# Patient Record
Sex: Female | Born: 1970 | Race: White | Hispanic: No | Marital: Single | Smoking: Current every day smoker
Health system: Southern US, Community
[De-identification: ages and names within clinical notes are randomized; demographics above are authoritative.]

## PROBLEM LIST (undated history)

## (undated) DIAGNOSIS — I10 Essential (primary) hypertension: Secondary | ICD-10-CM

## (undated) DIAGNOSIS — J45909 Unspecified asthma, uncomplicated: Secondary | ICD-10-CM

## (undated) DIAGNOSIS — E119 Type 2 diabetes mellitus without complications: Secondary | ICD-10-CM

## (undated) DIAGNOSIS — F419 Anxiety disorder, unspecified: Secondary | ICD-10-CM

## (undated) DIAGNOSIS — G919 Hydrocephalus, unspecified: Secondary | ICD-10-CM

## (undated) DIAGNOSIS — N189 Chronic kidney disease, unspecified: Secondary | ICD-10-CM

## (undated) HISTORY — DX: Essential (primary) hypertension: I10

## (undated) HISTORY — DX: Type 2 diabetes mellitus without complications: E11.9

## (undated) HISTORY — DX: Chronic kidney disease, unspecified: N18.9

## (undated) HISTORY — PX: TUBAL LIGATION: SHX77

## (undated) HISTORY — DX: Unspecified asthma, uncomplicated: J45.909

## (undated) HISTORY — DX: Anxiety disorder, unspecified: F41.9

## (undated) HISTORY — DX: Hydrocephalus, unspecified: G91.9

---

## 2009-11-27 ENCOUNTER — Emergency Department: Payer: Self-pay | Admitting: Emergency Medicine

## 2009-12-25 ENCOUNTER — Ambulatory Visit: Payer: Self-pay | Admitting: Urology

## 2010-03-21 ENCOUNTER — Emergency Department: Payer: Self-pay | Admitting: Emergency Medicine

## 2016-03-04 ENCOUNTER — Ambulatory Visit: Payer: Medicaid Other | Attending: Otolaryngology

## 2016-03-04 DIAGNOSIS — R0683 Snoring: Secondary | ICD-10-CM | POA: Diagnosis present

## 2016-03-04 DIAGNOSIS — G4730 Sleep apnea, unspecified: Secondary | ICD-10-CM | POA: Diagnosis present

## 2016-03-04 DIAGNOSIS — G4733 Obstructive sleep apnea (adult) (pediatric): Secondary | ICD-10-CM | POA: Insufficient documentation

## 2016-03-04 DIAGNOSIS — F5101 Primary insomnia: Secondary | ICD-10-CM | POA: Diagnosis not present

## 2016-03-04 DIAGNOSIS — I10 Essential (primary) hypertension: Secondary | ICD-10-CM | POA: Insufficient documentation

## 2016-06-18 ENCOUNTER — Other Ambulatory Visit: Payer: Self-pay | Admitting: Neurology

## 2016-06-18 DIAGNOSIS — G932 Benign intracranial hypertension: Secondary | ICD-10-CM

## 2016-07-01 ENCOUNTER — Other Ambulatory Visit: Payer: Self-pay | Admitting: Neurology

## 2016-07-01 DIAGNOSIS — G932 Benign intracranial hypertension: Secondary | ICD-10-CM

## 2016-07-10 ENCOUNTER — Ambulatory Visit: Admission: RE | Admit: 2016-07-10 | Payer: Medicaid Other | Source: Ambulatory Visit

## 2016-07-24 ENCOUNTER — Encounter: Payer: Self-pay | Admitting: Radiology

## 2016-07-24 ENCOUNTER — Ambulatory Visit
Admission: RE | Admit: 2016-07-24 | Discharge: 2016-07-24 | Disposition: A | Discharge status: Home / Self Care | Payer: Medicaid Other | Source: Ambulatory Visit | Attending: Neurology | Admitting: Neurology

## 2016-07-24 DIAGNOSIS — G932 Benign intracranial hypertension: Secondary | ICD-10-CM | POA: Diagnosis present

## 2016-08-17 DIAGNOSIS — Z6841 Body Mass Index (BMI) 40.0 and over, adult: Secondary | ICD-10-CM | POA: Insufficient documentation

## 2017-03-23 ENCOUNTER — Other Ambulatory Visit: Payer: Self-pay | Admitting: Neurology

## 2017-03-23 DIAGNOSIS — R4020 Unspecified coma: Secondary | ICD-10-CM

## 2017-04-09 ENCOUNTER — Ambulatory Visit
Admission: RE | Admit: 2017-04-09 | Discharge: 2017-04-09 | Disposition: A | Discharge status: Home / Self Care | Payer: Medicaid Other | Source: Ambulatory Visit | Attending: Neurology | Admitting: Neurology

## 2017-04-09 DIAGNOSIS — R4020 Unspecified coma: Secondary | ICD-10-CM | POA: Diagnosis present

## 2017-06-10 ENCOUNTER — Other Ambulatory Visit: Payer: Self-pay | Admitting: Neurosurgery

## 2017-06-10 DIAGNOSIS — G932 Benign intracranial hypertension: Secondary | ICD-10-CM

## 2017-06-18 ENCOUNTER — Ambulatory Visit
Admission: RE | Admit: 2017-06-18 | Discharge: 2017-06-18 | Disposition: A | Discharge status: Home / Self Care | Payer: Medicaid Other | Source: Ambulatory Visit | Attending: Neurosurgery | Admitting: Neurosurgery

## 2017-06-18 DIAGNOSIS — G932 Benign intracranial hypertension: Secondary | ICD-10-CM | POA: Insufficient documentation

## 2017-06-18 MED ORDER — LIDOCAINE HCL (PF) 1 % IJ SOLN
5.0000 mL | Freq: Once | INTRAMUSCULAR | Status: AC
  Administered 2017-06-18: 5 mL

## 2017-06-18 MED ORDER — ACETAMINOPHEN 500 MG PO TABS
1000.0000 mg | ORAL_TABLET | Freq: Four times a day (QID) | ORAL | Status: DC | PRN
  Filled 2017-06-18: qty 2

## 2017-08-13 DIAGNOSIS — Z720 Tobacco use: Secondary | ICD-10-CM | POA: Insufficient documentation

## 2017-08-19 DIAGNOSIS — I10 Essential (primary) hypertension: Secondary | ICD-10-CM | POA: Insufficient documentation

## 2017-12-15 IMAGING — MR MR [PERSON_NAME] HEAD
1 series · 48 of 48 positions shown · non-contrast
Comparison: None.

CLINICAL DATA: 45-year-old female with history of idiopathic
intracranial hypertension (pseudotumor cerebri) treated with shunt
placement 8888 secondary to visual loss. Headaches since December 2015.
Some blurred vision and dizziness. Initial encounter.

EXAM:
MRI HEAD WITHOUT CONTRAST
MRV HEAD WITHOUT CONTRAST
TECHNIQUE: Multiplanar, multiecho pulse sequences of the brain and surrounding
structures were obtained without intravenous contrast. Angiographic
images of the intracranial venous structures were obtained using MRV
technique without intravenous contrast.

[Series 3: tof_2d_veins · coronal · 3.0mm · 0.98mm/px · 48 of 95 slices shown]
[im 1/95]
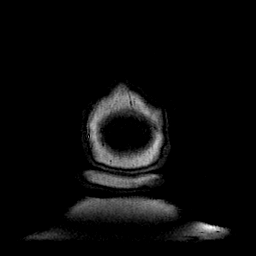
[im 3/95]
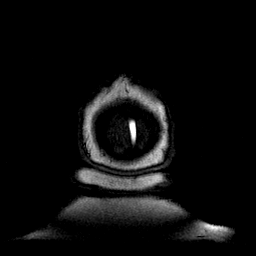
[im 5/95]
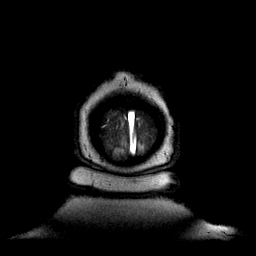
[im 7/95]
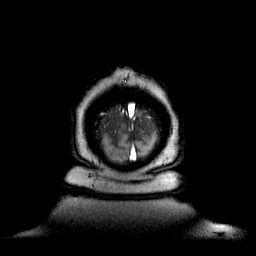
[im 9/95]
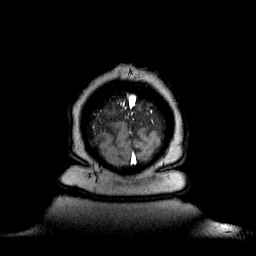
[im 11/95]
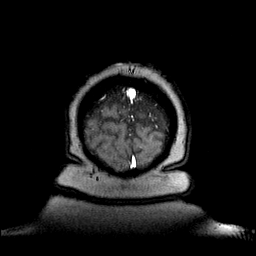
[im 13/95]
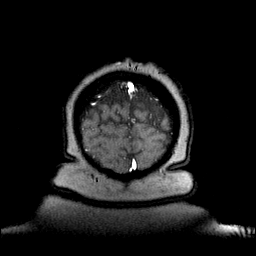
[im 15/95]
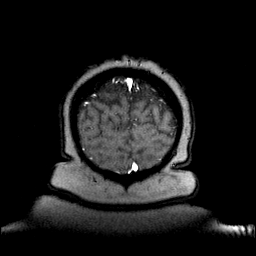
[im 17/95]
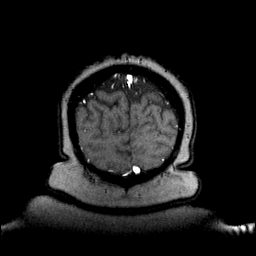
[im 19/95]
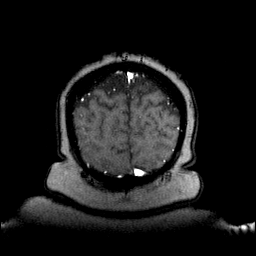
[im 21/95]
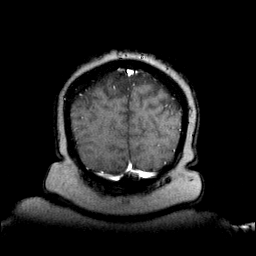
[im 23/95]
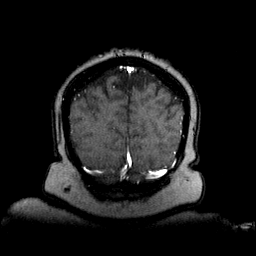
[im 25/95]
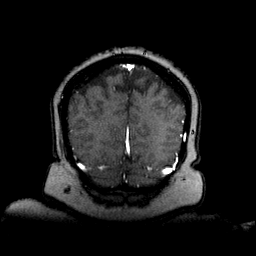
[im 27/95]
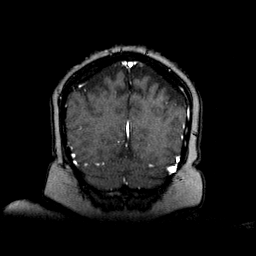
[im 29/95]
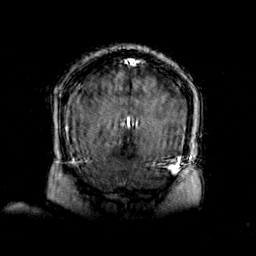
[im 31/95]
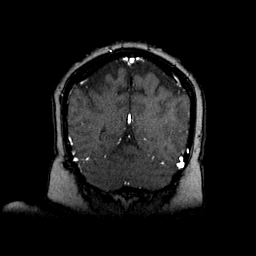
[im 33/95]
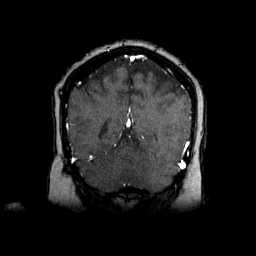
[im 35/95]
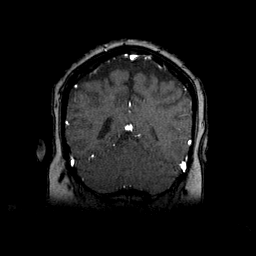
[im 37/95]
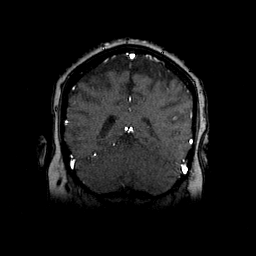
[im 39/95]
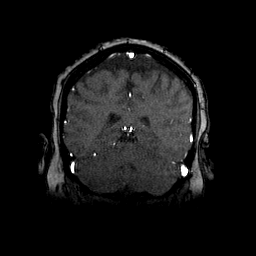
[im 41/95]
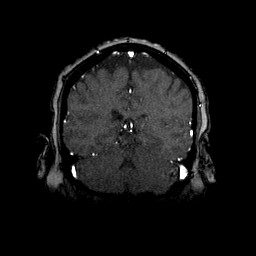
[im 43/95]
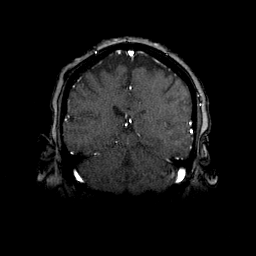
[im 45/95]
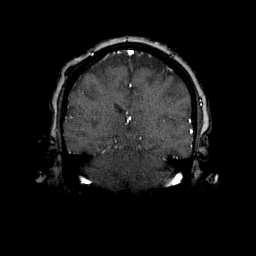
[im 47/95]
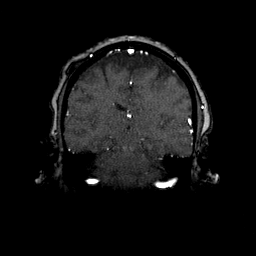
[im 49/95]
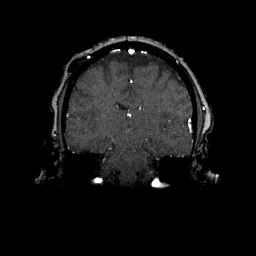
[im 51/95]
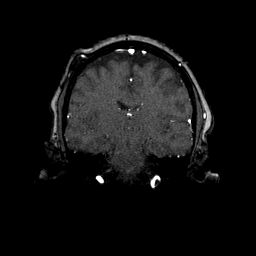
[im 53/95]
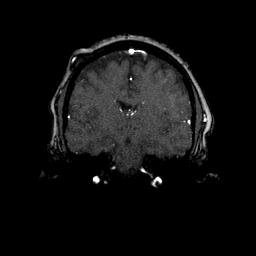
[im 55/95]
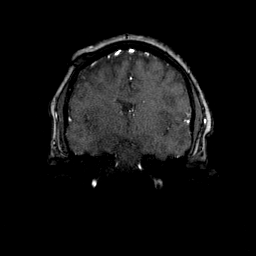
[im 57/95]
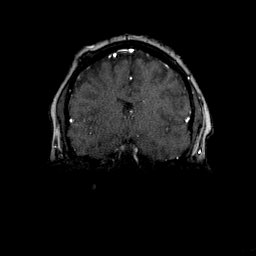
[im 59/95]
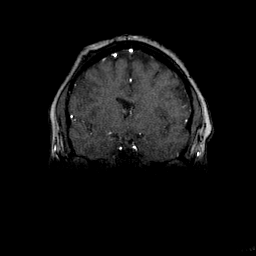
[im 61/95]
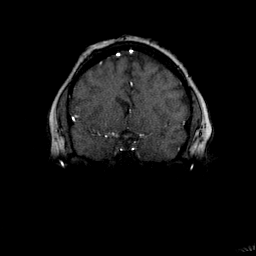
[im 63/95]
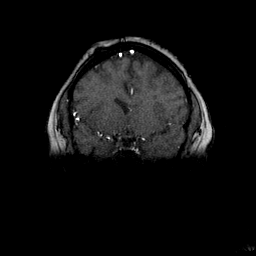
[im 65/95]
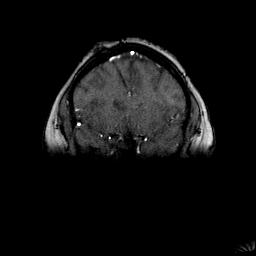
[im 67/95]
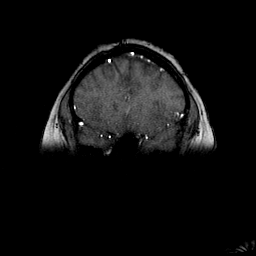
[im 69/95]
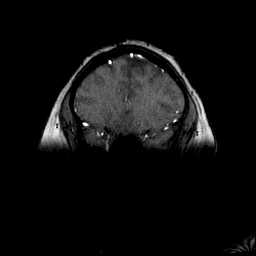
[im 71/95]
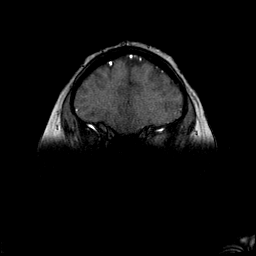
[im 73/95]
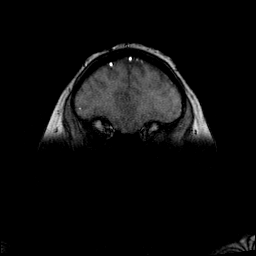
[im 75/95]
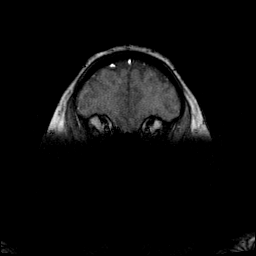
[im 77/95]
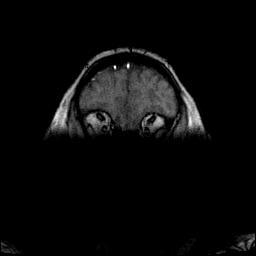
[im 79/95]
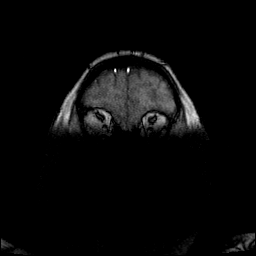
[im 81/95]
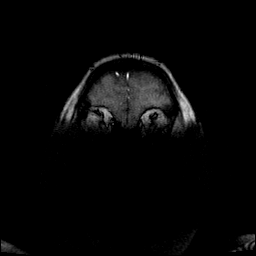
[im 83/95]
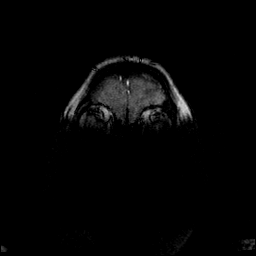
[im 85/95]
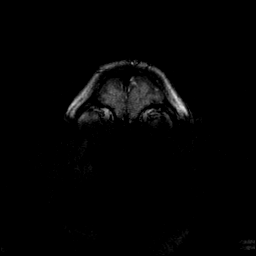
[im 87/95]
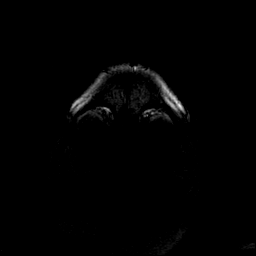
[im 89/95]
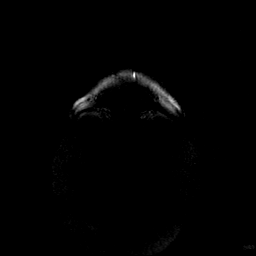
[im 91/95]
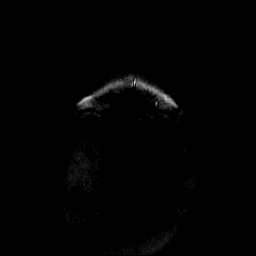
[im 93/95]
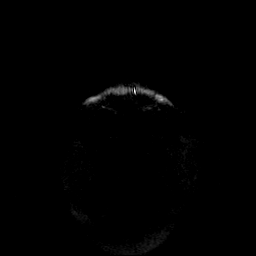
[im 95/95]
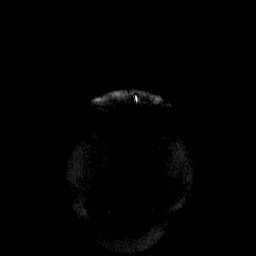

[48 of 48 positions shown; findings below may reference images not displayed]

FINDINGS: MRI HEAD WITHOUT CONTRAST FINDINGS

No acute infarct or intracranial hemorrhage.

Shunt catheter traverses the right frontal lobe crossing midline
with the tip in the superior aspect of the left lateral ventricle.
Asymmetric appearance of the lateral ventricles with slit like
appearance of the left lateral ventricle.

Partially empty slightly expanded sella consistent with patient's
history of pseudotumor. No flattening of the posterior aspect the
globes. Minimal prominence of peri optic spaces.

Prominent appearance of subarachnoid spaces in a symmetric fashion
involving the posterior frontal-parietal and occipital region
bilaterally may be related to atrophy or overshunting. Correlation
with prior exams would prove helpful with this finding as well. No
other findings of intracranial hypotension.

Small area of altered signal intensity within the anterior left
frontal white matter of indeterminate etiology. No adjacent
calvarial defect to suggest left frontal abnormality is related to
prior shunt placement. This may be related to prior ischemia. No
surrounding findings to suggest white matter changes of
demyelinating process, inflammatory process or migraine headaches.

No intracranial mass lesion noted on this unenhanced exam.

Cervicomedullary junction within normal limits.

Minimal mucosal thickening maxillary sinuses and ethmoid sinus air
cells.

Major intracranial vascular structures appear patent. Left vertebral
artery may end predominantly in a posterior inferior cerebellar
artery distribution.

MRV HEAD WITHOUT CONTRAST FINDINGS

Major dural sinuses are patent.
IMPRESSION: MRI HEAD

No acute infarct or intracranial hemorrhage.

Shunt catheter tip in the superior aspect of the left lateral
ventricle. Asymmetric appearance of the lateral ventricles with slit
like appearance of the left lateral ventricle. It would be helpful
to compared to prior examinations to determine if this represents a
change (as slit-like ventricles from over shunting can be
symptomatic).

Prominent appearance of subarachnoid spaces in a symmetric fashion
involving the posterior frontal-parietal and occipital region
bilaterally may be related to atrophy or overshunting. Correlation
with prior exams would prove helpful with this finding as well. No
other findings of intracranial hypotension.

Partially empty slightly expanded sella consistent with patient's
history of pseudotumor. No flattening of the posterior aspect the
globes. Minimal prominence of peri optic spaces.

Small area of altered signal intensity within the anterior left
frontal white matter of indeterminate etiology.

MRV HEAD WITHOUT CONTRAST FINDINGS

Major dural sinuses are patent.

## 2018-02-22 DIAGNOSIS — R635 Abnormal weight gain: Secondary | ICD-10-CM | POA: Insufficient documentation

## 2018-11-09 IMAGING — RF DG FLUORO GUIDE LUMBAR PUNCTURE
1 series · 1 of 1 positions shown · non-contrast
Comparison: none

CLINICAL DATA: The tumor cerebral read. Patient reports headaches
and double vision.

[Series 1: cp_standard · 0.17mm/px · 1 of 1 slices shown]
[im 1/1]
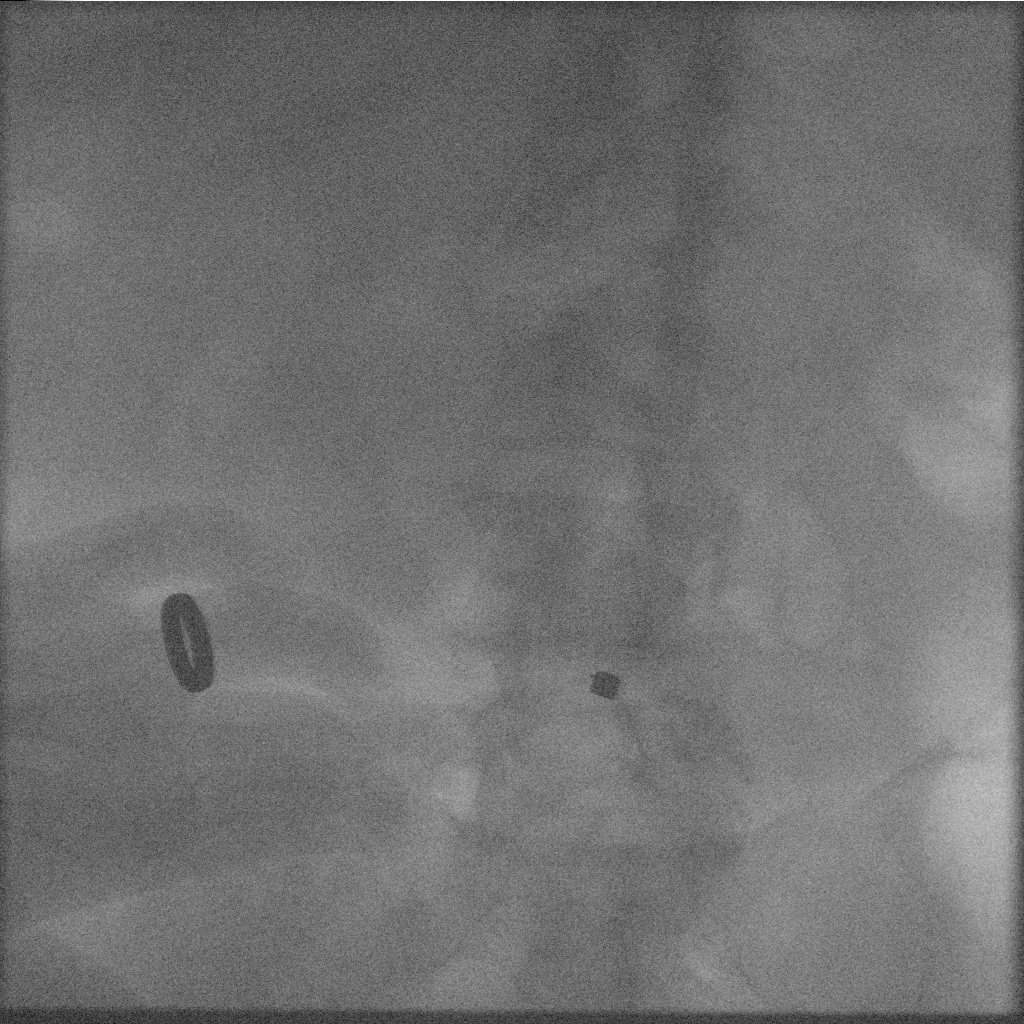

[1 of 1 positions shown; findings below may reference images not displayed]

EXAM:
DIAGNOSTIC LUMBAR PUNCTURE UNDER FLUOROSCOPIC GUIDANCE

FLUOROSCOPY TIME:  Fluoroscopy Time:  1 minutes, 12 seconds

Radiation Exposure Index (if provided by the fluoroscopic device):
48.6 mGy a

Number of Acquired Spot Images: 1 screen save.

PROCEDURE:
The anticipated procedure was discussed with the patient.Informed
consent was obtained from the patient prior to the procedure,
including potential complications of headache, allergy, and pain.
With the patient prone, the lower back was prepped with Betadine. 1%
Lidocaine was used for local anesthesia. Lumbar puncture was
performed at the L4-5 level using a 20 gauge 11.5 cm needle with
return of clear CSF with an opening pressure of 25 cm water. Twelve
ml of clear colorless CSF were obtained for laboratory studies.
Closing pressure was 17 cm water. The patient tolerated the
procedure well and there were no apparent complications.
IMPRESSION: Successful lumbar puncture at the L4-5 level. The opening pressure
was elevated at 25 cm of water. Closing pressure was reduced to 17
cm of water after removal of approximately 12 cc of CSF.

## 2021-05-13 DIAGNOSIS — T8332XA Displacement of intrauterine contraceptive device, initial encounter: Secondary | ICD-10-CM | POA: Insufficient documentation

## 2021-05-14 ENCOUNTER — Other Ambulatory Visit: Payer: Self-pay | Admitting: *Deleted

## 2021-05-14 DIAGNOSIS — N939 Abnormal uterine and vaginal bleeding, unspecified: Secondary | ICD-10-CM

## 2021-05-16 ENCOUNTER — Ambulatory Visit
Admission: RE | Admit: 2021-05-16 | Discharge: 2021-05-16 | Disposition: A | Discharge status: Home / Self Care | Payer: Self-pay | Source: Ambulatory Visit | Attending: *Deleted | Admitting: *Deleted

## 2021-05-16 ENCOUNTER — Ambulatory Visit
Admission: RE | Admit: 2021-05-16 | Discharge: 2021-05-16 | Disposition: A | Discharge status: Home / Self Care | Payer: Self-pay | Attending: Diagnostic Radiology | Admitting: Diagnostic Radiology

## 2021-05-16 ENCOUNTER — Other Ambulatory Visit
Admission: RE | Admit: 2021-05-16 | Discharge: 2021-05-16 | Disposition: A | Discharge status: Home / Self Care | Payer: Self-pay | Source: Home / Self Care | Attending: *Deleted | Admitting: *Deleted

## 2021-05-16 DIAGNOSIS — N939 Abnormal uterine and vaginal bleeding, unspecified: Secondary | ICD-10-CM | POA: Insufficient documentation

## 2021-05-16 LAB — CBC WITH DIFFERENTIAL/PLATELET
Abs Immature Granulocytes: 0.09 10*3/uL — ABNORMAL HIGH (ref 0.00–0.07)
Basophils Absolute: 0.1 10*3/uL (ref 0.0–0.1)
Basophils Relative: 1 %
Eosinophils Absolute: 0.4 10*3/uL (ref 0.0–0.5)
Eosinophils Relative: 3 %
HCT: 39 % (ref 36.0–46.0)
Hemoglobin: 12.3 g/dL (ref 12.0–15.0)
Immature Granulocytes: 1 %
Lymphocytes Relative: 18 %
Lymphs Abs: 2.6 10*3/uL (ref 0.7–4.0)
MCH: 24.7 pg — ABNORMAL LOW (ref 26.0–34.0)
MCHC: 31.5 g/dL (ref 30.0–36.0)
MCV: 78.3 fL — ABNORMAL LOW (ref 80.0–100.0)
Monocytes Absolute: 0.9 10*3/uL (ref 0.1–1.0)
Monocytes Relative: 6 %
Neutro Abs: 10.4 10*3/uL — ABNORMAL HIGH (ref 1.7–7.7)
Neutrophils Relative %: 71 %
Platelets: 429 10*3/uL — ABNORMAL HIGH (ref 150–400)
RBC: 4.98 MIL/uL (ref 3.87–5.11)
RDW: 17 % — ABNORMAL HIGH (ref 11.5–15.5)
WBC: 14.4 10*3/uL — ABNORMAL HIGH (ref 4.0–10.5)
nRBC: 0 % (ref 0.0–0.2)

## 2021-05-16 LAB — BASIC METABOLIC PANEL
Anion gap: 10 (ref 5–15)
BUN: 12 mg/dL (ref 6–20)
CO2: 22 mmol/L (ref 22–32)
Calcium: 9.5 mg/dL (ref 8.9–10.3)
Chloride: 105 mmol/L (ref 98–111)
Creatinine, Ser: 0.86 mg/dL (ref 0.44–1.00)
GFR, Estimated: >60 mL/min (ref >60)
Glucose, Bld: 121 mg/dL — ABNORMAL HIGH (ref 70–99)
Potassium: 4.4 mmol/L (ref 3.5–5.1)
Sodium: 137 mmol/L (ref 135–145)

## 2021-05-16 LAB — IRON AND TIBC
Iron: 23 ug/dL — ABNORMAL LOW (ref 28–170)
Saturation Ratios: 6 % — ABNORMAL LOW (ref 10.4–31.8)
TIBC: 405 ug/dL (ref 250–450)
UIBC: 382 ug/dL

## 2021-05-23 DIAGNOSIS — J45909 Unspecified asthma, uncomplicated: Secondary | ICD-10-CM | POA: Insufficient documentation

## 2021-05-23 DIAGNOSIS — N189 Chronic kidney disease, unspecified: Secondary | ICD-10-CM | POA: Insufficient documentation

## 2021-07-02 DIAGNOSIS — N8502 Endometrial intraepithelial neoplasia [EIN]: Secondary | ICD-10-CM | POA: Insufficient documentation

## 2023-01-05 ENCOUNTER — Ambulatory Visit (LOCAL_COMMUNITY_HEALTH_CENTER): Payer: Medicaid Other

## 2023-01-05 DIAGNOSIS — B850 Pediculosis due to Pediculus humanus capitis: Secondary | ICD-10-CM

## 2023-01-05 NOTE — Progress Notes (Signed)
In nurse clinic today with 52 yo granddaughter for lice check.  Lice seen by RN. No previous lice tx.   Granddaughter does not live with patient but she takes care of her granddaughter during day. Granddaughter has nits and has appt today.   Patient lives with her 2 adult daughters for total of #3 people in household. No one is pregnant in household.  Counseled regarding lice treatment. "Lice Treatment" info sheet given and explained.  [facility] copy given.   The patient was dispensed Nix (#4 individual boxes) today per SO Dr Ralene Bathe. I provided counseling today regarding the medication. We discussed the medication, the side effects and when to call clinic. Patient given the opportunity to ask questions. Questions answered.  Jerel Shepherd, RN

## 2023-01-06 MED ORDER — NIX CREME RINSE 1 % EX LIQD: 1.0000 | Freq: Once | CUTANEOUS | 0 refills | Status: AC

## 2023-05-15 ENCOUNTER — Emergency Department
Admission: EM | Admit: 2023-05-15 | Discharge: 2023-05-15 | Disposition: A | Discharge status: Home / Self Care | Payer: Medicaid Other | Attending: Emergency Medicine | Admitting: Emergency Medicine

## 2023-05-15 ENCOUNTER — Other Ambulatory Visit: Payer: Self-pay

## 2023-05-15 DIAGNOSIS — X58XXXA Exposure to other specified factors, initial encounter: Secondary | ICD-10-CM | POA: Insufficient documentation

## 2023-05-15 DIAGNOSIS — S0993XA Unspecified injury of face, initial encounter: Secondary | ICD-10-CM | POA: Diagnosis present

## 2023-05-15 DIAGNOSIS — K047 Periapical abscess without sinus: Secondary | ICD-10-CM | POA: Insufficient documentation

## 2023-05-15 DIAGNOSIS — S025XXA Fracture of tooth (traumatic), initial encounter for closed fracture: Secondary | ICD-10-CM | POA: Insufficient documentation

## 2023-05-15 MED ORDER — HYDROCODONE-ACETAMINOPHEN 5-325 MG PO TABS: 1.0000 | ORAL_TABLET | ORAL | 0 refills | Status: DC | PRN

## 2023-05-15 MED ORDER — AMOXICILLIN-POT CLAVULANATE 875-125 MG PO TABS
1.0000 | ORAL_TABLET | Freq: Once | ORAL | Status: AC
  Administered 2023-05-15: 1 via ORAL
  Filled 2023-05-15: qty 1

## 2023-05-15 MED ORDER — HYDROCODONE-ACETAMINOPHEN 5-325 MG PO TABS
1.0000 | ORAL_TABLET | ORAL | Status: AC
  Administered 2023-05-15: 1 via ORAL
  Filled 2023-05-15: qty 1

## 2023-05-15 MED ORDER — AMOXICILLIN-POT CLAVULANATE 875-125 MG PO TABS: 1.0000 | ORAL_TABLET | Freq: Two times a day (BID) | ORAL | 0 refills | Status: AC

## 2023-05-15 NOTE — ED Triage Notes (Signed)
Pt to ed from home via POV for dental pain x 2 days. Pt states a tooth broke off in the top upper row of her teeth last week. Pt has not been to see a dentist fro same. Pt is caox4, in no acute distress and ambulatory in triage,

## 2023-05-15 NOTE — ED Provider Notes (Signed)
[place] EMERGENCY DEPARTMENT AT [facility] Provider Note   CSN: [id number] Arrival date & time: 05/15/23  1913     History  Chief Complaint  Patient presents with   Dental Pain    X 2 days    Bianca Anthony is a 52 y.o. female.  Presents to the emergency department for evaluation of dental pain for 2 days.  She [place] right upper first molar last week, has been having some pain and swelling over last couple days.  No fevers chills has had a bit of swelling to the right cheek.  No drainage.  Tolerating p.o. well  HPI     Home Medications Prior to Admission medications   Medication Sig Start Date End Date Taking? Authorizing Provider  amoxicillin-clavulanate (AUGMENTIN) 875-125 MG tablet Take 1 tablet by mouth 2 (two) times daily for 10 days. 05/15/23 05/25/23 Yes Evon Slack, PA-C  HYDROcodone-acetaminophen (NORCO/VICODIN) 5-325 MG tablet Take 1 tablet by mouth every 4 (four) hours as needed for moderate pain. 05/15/23 05/14/24 Yes Evon Slack, PA-C  albuterol (PROVENTIL HFA;VENTOLIN HFA) 108 (90 Base) MCG/ACT inhaler Inhale 1-2 puffs every 6 (six) hours as needed into the lungs for wheezing or shortness of breath.    [provider]  cholecalciferol (VITAMIN D) 1000 units tablet Take 1,000 Units daily by mouth. Patient not taking: Reported on 01/05/2023    [provider]  cyclobenzaprine (FLEXERIL) 10 MG tablet Take 10 mg as needed by mouth for muscle spasms.    [provider]  gabapentin (NEURONTIN) 100 MG capsule Take 100 mg 3 (three) times daily by mouth.    [provider]  propranolol (INNOPRAN XL) 120 MG 24 hr capsule Take 120 mg at bedtime by mouth.    [provider]  ranitidine (ZANTAC) 150 MG tablet Take 150 mg daily by mouth. Patient not taking: Reported on 01/05/2023    [provider]  thiamine (VITAMIN B-1) 100 MG tablet Take 100 mg daily by mouth. Patient not taking: Reported on 01/05/2023     [provider]  VITAMIN B1-B12 IJ Inject every 30 (thirty) days as directed. Patient not taking: Reported on 01/05/2023    [provider]      Allergies    Diamox [acetazolamide]    Review of Systems   Review of Systems  Physical Exam Updated Vital Signs BP (!) 160/86   Pulse 98   Temp 98.3 F (36.8 C)   Resp 18   Ht 5\' 9"  (1.753 m)   Wt (!) 148.3 kg   SpO2 100%   BMI 48.29 kg/m  Physical Exam Constitutional:      Appearance: She is well-developed.  HENT:     Head: Normocephalic and atraumatic.     Mouth/Throat:     Mouth: Mucous membranes are moist.     Pharynx: No oropharyngeal exudate or posterior oropharyngeal erythema.     Comments: Right maxillary soft tissue swelling with no induration or abscess formation.  No fluctuance.  Tender along the right first upper molar.  No fluctuant abscess.  Tooth has been fractured and appears decayed.  No intraoral lesions.  No trismus Eyes:     Conjunctiva/sclera: Conjunctivae normal.  Cardiovascular:     Rate and Rhythm: Normal rate.  Pulmonary:     Effort: Pulmonary effort is normal. No respiratory distress.  Musculoskeletal:        General: Normal range of motion.     Cervical back: Normal range of motion.  Skin:    General: Skin is warm.     Findings: No rash.  Neurological:     General: No focal deficit present.     Mental Status: She is alert and oriented to person, place, and time.  Psychiatric:        Behavior: Behavior normal.        Thought Content: Thought content normal.     ED Results / Procedures / Treatments   Labs (all labs ordered are listed, but only abnormal results are displayed) Labs Reviewed - No data to display  EKG None  Radiology No results found.  Procedures Procedures    Medications Ordered in ED Medications  amoxicillin-clavulanate (AUGMENTIN) 875-125 MG per tablet 1 tablet (has no administration in time range)  HYDROcodone-acetaminophen (NORCO/VICODIN) 5-325  MG per tablet 1 tablet (has no administration in time range)    ED Course/ Medical Decision Making/ A&P                                 Medical Decision Making Risk Prescription drug management.   52 year old with right first upper molar tooth fracture with dental decay.  Patient has nonfluctuant dental abscess/cellulitis.  Will place on Augmentin.  They will follow-up with dental clinic.  Vital signs are stable afebrile.  They understand signs symptoms return to the ER for. Final Clinical Impression(s) / ED Diagnoses Final diagnoses:  Dental infection  Closed fracture of tooth, initial encounter  Dental abscess    Rx / DC Orders ED Discharge Orders          Ordered    amoxicillin-clavulanate (AUGMENTIN) 875-125 MG tablet  2 times daily        05/15/23 2148    HYDROcodone-acetaminophen (NORCO/VICODIN) 5-325 MG tablet  Every 4 hours PRN        05/15/23 2148              Evon Slack, PA-C 05/15/23 2152    Corena Herter, MD 05/18/23 1330

## 2023-05-15 NOTE — Discharge Instructions (Signed)
Please take antibiotics as prescribed.  You may use Norco as needed for moderate to severe pain.  Take Tylenol and ibuprofen as needed for mild to moderate pain.  Return to the ER for any fevers increasing pain swelling redness difficulty swallowing or any worsening symptoms or urgent changes in your health

## 2023-11-13 ENCOUNTER — Ambulatory Visit: Payer: Self-pay

## 2023-11-13 NOTE — Telephone Encounter (Signed)
 Copied from CRM [id number]. Topic: Clinical - Red Word Triage >> Nov 13, 2023 11:03 AM Izetta Dakin wrote: [facility] that prompted transfer to Nurse Triage: rt leg/foot pain and swelling   Chief Complaint: Right foot pain/swelling Symptoms: pain, swelling Frequency: "several years" Pertinent Negatives: Patient denies chest pain, difficulty breathing, abdominal pain, nausea, vomiting, diarrhea, fever Disposition: [x] ED /[] Urgent Care (no appt availability in office) / [] Appointment(In office/virtual)/ []  [facility]/ [] Home Care/ [] Refused Recommended Disposition /[]  Mobile Bus/ []  Follow-up with PCP Additional Notes: Patient called and advised Patient denies any chest pain, difficulty breathing, abdominal pain, nausea, vomiting, diarrhea, fever. Patient states that last month she fell down some steps last month where she lives and she believes she injured her right lower leg and her lower back (with pain radiating into the right hip). Patient also states that she was diagnosed with diabetes and has ran out of her medication. Patient states that both of her feet have been swelling for years. Patient states that below her knee down to her ankle is painful. She also states that her right lower back hurts radiating into her right hip since she fell last month. Patient states that the last time she had her feet evaluated for the swelling was in 2022. Patient states that she is out of a lot of her medications at this time. Patient is unsure what her blood sugar is at this time. With no available appointments in the office that the patient wants to establish care at, it is recommended that the patient goes to the Emergency Room to get her the swelling of her feet evaluated, the pain on the lateral aspect of her right lower leg evaluated, her back pain that radiates into her right hip evaluated, and get her medications taken care of that she has ran out of.  Patient is advised that after  she has taken care of these acute issues today at the Emergency Room, to follow back up with [place] to establish care.  Patient was okay with this plan. Patient states that she can get her daughter to take her to the Emergency Room. Patient is also advised that at any time she can call 911 for an ambulance to take her to the Emergency Room if she needs it.  Patient verbalized understanding.     Reason for Disposition  Patient sounds very sick or weak to the triager  Answer Assessment - Initial Assessment Questions 1. ONSET: "When did the pain start?"      Since she fell a month ago 2. LOCATION: "Where is the pain located?"      Right lower leg 3. PAIN: "How bad is the pain?"    (Scale 1-10; or mild, moderate, severe)   -  MILD (1-3): doesn't interfere with normal activities    -  MODERATE (4-7): interferes with normal activities (e.g., work or school) or awakens from sleep, limping    -  SEVERE (8-10): excruciating pain, unable to do any normal activities, unable to walk     5 4. WORK OR EXERCISE: "Has there been any recent work or exercise that involved this part of the body?"      Daily use 5. CAUSE: "What do you think is causing the leg pain?"     Fall a month ago 6. OTHER SYMPTOMS: "Do you have any other symptoms?" (e.g., chest pain, back pain, breathing difficulty, swelling, rash, fever, numbness, weakness)     Back pain hip pain  Protocols used:  Leg Pain-A-AH

## 2023-12-15 ENCOUNTER — Other Ambulatory Visit: Payer: Self-pay

## 2023-12-15 DIAGNOSIS — M5441 Lumbago with sciatica, right side: Secondary | ICD-10-CM | POA: Diagnosis not present

## 2023-12-15 DIAGNOSIS — M545 Low back pain, unspecified: Secondary | ICD-10-CM | POA: Diagnosis present

## 2023-12-15 NOTE — ED Triage Notes (Signed)
 Pt presents to the ED via POV with complaints of Lower back, R hip, and R leg pain following a fall 2 months ago. Pt states that the pain has been present since the fall. Last took Tylenol  and advil around 2100 with minimal improvement. A&Ox4 at this time. Denies CP or SOB.

## 2023-12-16 ENCOUNTER — Encounter: Payer: Self-pay | Admitting: Emergency Medicine

## 2023-12-16 ENCOUNTER — Emergency Department

## 2023-12-16 ENCOUNTER — Emergency Department
Admission: EM | Admit: 2023-12-16 | Discharge: 2023-12-16 | Disposition: A | Discharge status: Home / Self Care | Attending: Emergency Medicine | Admitting: Emergency Medicine

## 2023-12-16 DIAGNOSIS — M5441 Lumbago with sciatica, right side: Secondary | ICD-10-CM

## 2023-12-16 DIAGNOSIS — M533 Sacrococcygeal disorders, not elsewhere classified: Secondary | ICD-10-CM

## 2023-12-16 MED ORDER — LIDOCAINE 5 % EX PTCH: 1.0000 | MEDICATED_PATCH | CUTANEOUS | 0 refills | Status: DC

## 2023-12-16 MED ORDER — LIDOCAINE 5 % EX PTCH
1.0000 | MEDICATED_PATCH | CUTANEOUS | Status: DC
  Administered 2023-12-16: 1 via TRANSDERMAL
  Filled 2023-12-16: qty 1

## 2023-12-16 MED ORDER — METHYLPREDNISOLONE 4 MG PO TBPK: ORAL_TABLET | ORAL | 0 refills | Status: DC

## 2023-12-16 MED ORDER — PREDNISONE 20 MG PO TABS
30.0000 mg | ORAL_TABLET | Freq: Once | ORAL | Status: AC
  Administered 2023-12-16: 30 mg via ORAL
  Filled 2023-12-16: qty 1

## 2023-12-16 MED ORDER — KETOROLAC TROMETHAMINE 60 MG/2ML IM SOLN
30.0000 mg | Freq: Once | INTRAMUSCULAR | Status: AC
  Administered 2023-12-16: 30 mg via INTRAMUSCULAR
  Filled 2023-12-16: qty 2

## 2023-12-16 MED ORDER — HYDROCODONE-ACETAMINOPHEN 5-325 MG PO TABS
1.0000 | ORAL_TABLET | Freq: Once | ORAL | Status: AC
  Administered 2023-12-16: 1 via ORAL
  Filled 2023-12-16: qty 1

## 2023-12-16 MED ORDER — CYCLOBENZAPRINE HCL 5 MG PO TABS: ORAL_TABLET | ORAL | 0 refills | Status: DC

## 2023-12-16 MED ORDER — CYCLOBENZAPRINE HCL 10 MG PO TABS
5.0000 mg | ORAL_TABLET | Freq: Once | ORAL | Status: AC
  Administered 2023-12-16: 5 mg via ORAL
  Filled 2023-12-16: qty 1

## 2023-12-16 MED ORDER — NAPROXEN 500 MG PO TABS: 500.0000 mg | ORAL_TABLET | Freq: Two times a day (BID) | ORAL | 0 refills | Status: DC

## 2023-12-16 NOTE — ED Provider Notes (Signed)
 [facility] Provider Note    Event Date/Time   First MD Initiated Contact with Patient 12/16/23 0008     (approximate)   History   Tailbone Pain and Leg Pain   HPI  Bianca Anthony is a 53 y.o. female who presents to the ED from home with a chief complaint of low back pain, tailbone pain and right leg pain status post a fall 2 months ago in March.  She never sought medical evaluation after the fall.  Reports worsening pain in her lower back radiating to her right leg with occasional numbness.  Denies extremity weakness.  Denies bowel or bladder incontinence.  Denies dysuria.     Past Medical History  No past medical history on file.   Active Problem List  There are no active problems to display for this patient.    Past Surgical History  No past surgical history on file.   Home Medications   Prior to Admission medications   Medication Sig Start Date End Date Taking? Authorizing Provider  albuterol (PROVENTIL HFA;VENTOLIN HFA) 108 (90 Base) MCG/ACT inhaler Inhale 1-2 puffs every 6 (six) hours as needed into the lungs for wheezing or shortness of breath.    [provider]  cholecalciferol (VITAMIN D) 1000 units tablet Take 1,000 Units daily by mouth. Patient not taking: Reported on 01/05/2023    [provider]  cyclobenzaprine (FLEXERIL) 10 MG tablet Take 10 mg as needed by mouth for muscle spasms.    [provider]  gabapentin (NEURONTIN) 100 MG capsule Take 100 mg 3 (three) times daily by mouth.    [provider]  HYDROcodone -acetaminophen  (NORCO/VICODIN) 5-325 MG tablet Take 1 tablet by mouth every 4 (four) hours as needed for moderate pain. 05/15/23 05/14/24  Coralyn Derry, PA-C  propranolol (INNOPRAN XL) 120 MG 24 hr capsule Take 120 mg at bedtime by mouth.    [provider]  ranitidine (ZANTAC) 150 MG tablet Take 150 mg daily by mouth. Patient not taking: Reported on 01/05/2023    [provider]  thiamine (VITAMIN B-1) 100 MG tablet Take 100 mg daily by mouth. Patient not taking: Reported on 01/05/2023    [provider]  VITAMIN B1-B12 IJ Inject every 30 (thirty) days as directed. Patient not taking: Reported on 01/05/2023    [provider]     Allergies  Diamox [acetazolamide]   Family History  No family history on file.   Physical Exam  Triage Vital Signs: ED Triage Vitals  Encounter Vitals Group     BP 12/15/23 2334 (!) 182/91     Systolic BP Percentile --      Diastolic BP Percentile --      Pulse Rate 12/15/23 2334 93     Resp 12/15/23 2334 18     Temp 12/15/23 2334 98.6 F (37 C)     Temp Source 12/15/23 2334 Oral     SpO2 12/15/23 2334 97 %     Weight 12/15/23 2333 (!) 314 lb (142.4 kg)     Height 12/15/23 2333 5\' 9"  (1.753 m)     Head Circumference --      Peak Flow --      Pain Score 12/15/23 2333 5     Pain Loc --      Pain Education --      Exclude from Growth Chart --     Updated Vital Signs: BP (!) 161/79 (BP Location: Left Arm)  Pulse 69   Temp 98.6 F (37 C) (Oral)   Resp 18   Ht 5\' 9"  (1.753 m)   Wt (!) 142.4 kg   SpO2 99%   BMI 46.37 kg/m    General: Awake, mild distress.  CV:  RRR.  Good peripheral perfusion.  Resp:  Normal effort.  CTAB. Abd:  Nontender.  No distention.  Other:  Midline lumbar spinal and coccyx tenderness to palpation with surrounding muscle spasms.  Negative straight leg raise.  5/5 motor strength and sensation BLE.  Palpable distal pulses.   ED Results / Procedures / Treatments  Labs (all labs ordered are listed, but only abnormal results are displayed) Labs Reviewed - No data to display   EKG  None   RADIOLOGY I have independently visualized interpreted patient's imaging studies as well as noted the radiology interpretation:  Lumbar spine: Negative  Sacrum/coccyx: Negative  Official radiology report(s): DG Sacrum/Coccyx Result Date: 12/16/2023 CLINICAL DATA:   Fall EXAM: SACRUM AND COCCYX - 2+ VIEW COMPARISON:  None Available. FINDINGS: There is no evidence of fracture or other focal bone lesions. IMPRESSION: Negative. Electronically Signed   By: Juanetta Nordmann M.D.   On: 12/16/2023 01:48   DG Lumbar Spine Complete Result Date: 12/16/2023 CLINICAL DATA:  Fall with back pain EXAM: LUMBAR SPINE - COMPLETE 4+ VIEW COMPARISON:  None Available. FINDINGS: There is no evidence of lumbar spine fracture. Alignment is normal. Intervertebral disc spaces are maintained. IMPRESSION: Negative. Electronically Signed   By: Juanetta Nordmann M.D.   On: 12/16/2023 01:45     PROCEDURES:  Critical Care performed: No  Procedures   MEDICATIONS ORDERED IN ED: Medications  lidocaine  (LIDODERM ) 5 % 1 patch (1 patch Transdermal Patch Applied 12/16/23 0041)  ketorolac (TORADOL) injection 30 mg (30 mg Intramuscular Given 12/16/23 0045)  cyclobenzaprine (FLEXERIL) tablet 5 mg (5 mg Oral Given 12/16/23 0042)  predniSONE (DELTASONE) tablet 30 mg (30 mg Oral Given 12/16/23 0044)  HYDROcodone -acetaminophen  (NORCO/VICODIN) 5-325 MG per tablet 1 tablet (1 tablet Oral Given 12/16/23 0043)     IMPRESSION / MDM / ASSESSMENT AND PLAN / ED COURSE  I reviewed the triage vital signs and the nursing notes.                             53 year old female presenting with low back pain and tailbone pain x 2 months.  Will obtain plain film x-rays, start steroid taper, IM Ketorolac/Norco for pain, Flexeril for muscle spasms.  Will apply Lidoderm  patch.  Will reassess.  Patient's presentation is most consistent with acute, uncomplicated illness.   Clinical Course as of 12/16/23 0157  Wed Dec 16, 2023  0156 X-rays negative.  Will discharge home on NSAIDs, Medrol Dosepak, muscle relaxer, lidocaine  patch and referred to orthopedics for outpatient follow-up.  Strict return precautions given.  Patient and family member verbalized understanding and agreed with plan of care. [JS]    Clinical Course  User Index [JS] Norlene Beavers, MD     FINAL CLINICAL IMPRESSION(S) / ED DIAGNOSES   Final diagnoses:  Acute midline low back pain with right-sided sciatica  Coccyx pain     Rx / DC Orders   ED Discharge Orders     None        Note:  This document was prepared using Dragon voice recognition software and may include unintentional dictation errors.   Anay Walter J, MD 12/16/23 [phone]

## 2023-12-16 NOTE — Discharge Instructions (Signed)
 You may take medicines as needed for pain and muscle relaxation.  Take and finish steroid taper as prescribed.  Apply Lidocaine  patch to affected area as instructed.  Return to the ER for worsening symptoms, persistent vomiting, extremity weakness, losing control of your bowel or bladder, or other concerns.

## 2024-03-11 ENCOUNTER — Ambulatory Visit: Admitting: Internal Medicine

## 2024-03-11 ENCOUNTER — Encounter: Payer: Self-pay | Admitting: Internal Medicine

## 2024-03-11 ENCOUNTER — Other Ambulatory Visit: Payer: Self-pay

## 2024-03-11 VITALS — BP 142/84 | HR 96 | Temp 98.1°F | Resp 18 | Ht 69.0 in | Wt 325.8 lb

## 2024-03-11 DIAGNOSIS — Z982 Presence of cerebrospinal fluid drainage device: Secondary | ICD-10-CM

## 2024-03-11 DIAGNOSIS — M5431 Sciatica, right side: Secondary | ICD-10-CM

## 2024-03-11 DIAGNOSIS — Z1211 Encounter for screening for malignant neoplasm of colon: Secondary | ICD-10-CM

## 2024-03-11 DIAGNOSIS — G4733 Obstructive sleep apnea (adult) (pediatric): Secondary | ICD-10-CM

## 2024-03-11 DIAGNOSIS — R600 Localized edema: Secondary | ICD-10-CM

## 2024-03-11 DIAGNOSIS — G919 Hydrocephalus, unspecified: Secondary | ICD-10-CM

## 2024-03-11 DIAGNOSIS — Z23 Encounter for immunization: Secondary | ICD-10-CM | POA: Diagnosis not present

## 2024-03-11 DIAGNOSIS — E559 Vitamin D deficiency, unspecified: Secondary | ICD-10-CM

## 2024-03-11 DIAGNOSIS — Z1159 Encounter for screening for other viral diseases: Secondary | ICD-10-CM

## 2024-03-11 DIAGNOSIS — J4520 Mild intermittent asthma, uncomplicated: Secondary | ICD-10-CM

## 2024-03-11 DIAGNOSIS — E538 Deficiency of other specified B group vitamins: Secondary | ICD-10-CM

## 2024-03-11 DIAGNOSIS — I10 Essential (primary) hypertension: Secondary | ICD-10-CM | POA: Diagnosis not present

## 2024-03-11 DIAGNOSIS — Z905 Acquired absence of kidney: Secondary | ICD-10-CM | POA: Insufficient documentation

## 2024-03-11 DIAGNOSIS — Z1231 Encounter for screening mammogram for malignant neoplasm of breast: Secondary | ICD-10-CM

## 2024-03-11 DIAGNOSIS — Z794 Long term (current) use of insulin: Secondary | ICD-10-CM

## 2024-03-11 DIAGNOSIS — E119 Type 2 diabetes mellitus without complications: Secondary | ICD-10-CM

## 2024-03-11 DIAGNOSIS — Z114 Encounter for screening for human immunodeficiency virus [HIV]: Secondary | ICD-10-CM

## 2024-03-11 DIAGNOSIS — Z13220 Encounter for screening for lipoid disorders: Secondary | ICD-10-CM

## 2024-03-11 LAB — POCT GLYCOSYLATED HEMOGLOBIN (HGB A1C): Hemoglobin A1C: 7.2 % — AB (ref 4.0–5.6)

## 2024-03-11 MED ORDER — ALBUTEROL SULFATE HFA 108 (90 BASE) MCG/ACT IN AERS: 1.0000 | INHALATION_SPRAY | Freq: Four times a day (QID) | RESPIRATORY_TRACT | 2 refills | Status: AC | PRN

## 2024-03-11 MED ORDER — CYCLOBENZAPRINE HCL 5 MG PO TABS
ORAL_TABLET | ORAL | 0 refills | Status: DC
Start: 2024-03-11 — End: 2024-05-11

## 2024-03-11 MED ORDER — LIDOCAINE 4 % EX PTCH: 1.0000 | MEDICATED_PATCH | CUTANEOUS | 0 refills | Status: DC

## 2024-03-11 NOTE — Patient Instructions (Addendum)
 It was great seeing you today!  Plan discussed at today's visit: -Blood work ordered today, results will be uploaded to MyChart.  -Please call phone number on card to schedule mammogram -Referral placed to Neurology for VP shunt management  -Referral placed to GI for colon cancer screening -Tdap and Prevnar 20 administered today -Albuterol  refilled to use as needed -Muscle relaxer to take at bedtime and lidocaine  patch refilled as well   Follow up in: 1 month  Take care and let us  know if you have any questions or concerns prior to your next visit.  Dr. Bernardo

## 2024-03-11 NOTE — Progress Notes (Signed)
 New Patient Office Visit  Subjective    Patient ID: Bianca Anthony, female    DOB: 1971/04/12  Age: 53 y.o. MRN: [id number]  CC:  Chief Complaint  Patient presents with   Establish Care    HPI VIA ROSADO presents to establish care. She has not been seen by a healthcare provider for her chronic medical conditions in several years.   Discussed the use of AI scribe software for clinical note transcription with the patient, who gave verbal consent to proceed.  History of Present Illness Bianca Anthony is a 53 year old female who presents for re-establishment of care after moving.  She is not currently taking any prescribed medications, using Tylenol  and ibuprofen as needed. Her blood pressure is 142/84 mmHg, and she is not on antihypertensive medication. She has chronic kidney disease, stage 2, and had her left kidney removed in 2010 or 2011 due to a staghorn stone. Her diabetes is controlled with an A1c of 7.2% without medication. She has asthma and uses albuterol  inhalers as needed, especially when climbing stairs.  She experienced a fall on May 14th, resulting in pain in her tailbone, lower back, and leg. X-rays were negative, and she was diagnosed with sciatica, treated with steroids, muscle relaxers, and lidocaine  patches.  She has sleep apnea and owns a CPAP machine but has not been using it recently. She sleeps only two to three hours at a time due to back pain from sciatica. She experiences chronic lymphedema in her legs, causing swelling and skin changes, and has difficulty managing it with one pair of compression stockings.  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   Hx of Hydrocephalus/Pseudotumor Cerebri -s/p 2 VP shunts -Had been following with [facility] and [facility] but hasn't been seen in several  years  Hypertension/OSA: -Medications: nothing currently - had been on Propanolol but not now -Also history of OSA, had sleep study years ago and was given a CPAP but is not currently using  Diabetes, Type 2: -Last A1c 7.2% 8/25 -Medications: Nothing  -Checking BG at home: not checking -Eye exam: Due, discuss more at follow up -Foot exam: Due, discuss at follow up -Microalbumin: Due -Statin: No -PNA vaccine: Due today  CKD -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 -Had been following with Nephrology but no longer  Asthma:  -Asthma status: stable -Current Treatments: Albuterol  PRN -Satisfied with current treatment?: yes -Albuterol /rescue inhaler frequency: after going up steps at house -Limitation of activity: yes -Current upper respiratory symptoms: no -Pneumovax: Not up to Date -Influenza: Not up to Date  Health Maintenance: -Blood work due -Mammogram due -Colon cancer screening:  -Tdap and Prevnar 20 due  Outpatient Encounter Medications as of 03/11/2024  Medication Sig   albuterol  (PROVENTIL  HFA;VENTOLIN  HFA) 108 (90 Base) MCG/ACT inhaler Inhale 1-2 puffs every 6 (six) hours as needed into the lungs for wheezing or shortness of breath.   lidocaine  (LIDODERM ) 5 % Place 1 patch onto the skin daily. Remove & Discard patch within 12 hours or as directed by MD   naproxen  (NAPROSYN ) 500 MG tablet Take 1 tablet (500 mg total) by mouth 2 (two) times daily with a meal.   cholecalciferol (VITAMIN D) 1000 units tablet Take 1,000 Units daily by mouth. (Patient not taking: Reported on 03/11/2024)   cyclobenzaprine  (FLEXERIL ) 5 MG tablet 1 tablet every 8 hours as needed  for muscle spasms (Patient not taking: Reported on 03/11/2024)   gabapentin (NEURONTIN) 100 MG capsule Take 100 mg 3 (three) times daily by mouth. (Patient not taking: Reported on 03/11/2024)   HYDROcodone -acetaminophen  (NORCO/VICODIN) 5-325 MG tablet Take 1 tablet by mouth every 4 (four) hours as needed for moderate  pain. (Patient not taking: Reported on 03/11/2024)   methylPREDNISolone  (MEDROL  DOSEPAK) 4 MG TBPK tablet Take as directed (Patient not taking: Reported on 03/11/2024)   propranolol (INNOPRAN XL) 120 MG 24 hr capsule Take 120 mg at bedtime by mouth. (Patient not taking: Reported on 03/11/2024)   ranitidine (ZANTAC) 150 MG tablet Take 150 mg daily by mouth. (Patient not taking: Reported on 03/11/2024)   thiamine (VITAMIN B-1) 100 MG tablet Take 100 mg daily by mouth. (Patient not taking: Reported on 03/11/2024)   VITAMIN B1-B12 IJ Inject every 30 (thirty) days as directed. (Patient not taking: Reported on 03/11/2024)   No facility-administered encounter medications on file as of 03/11/2024.    Past Medical History:  Diagnosis Date   Anxiety    Asthma    Chronic kidney disease    Diabetes mellitus without complication (HCC)    Hypertension     Past Surgical History:  Procedure Laterality Date   TUBAL LIGATION      No family history on file.  Social History   Socioeconomic History   Marital status: Single    Spouse name: Not on file   Number of children: 3   Years of education: Not on file   Highest education level: Not on file  Occupational History   Not on file  Tobacco Use   Smoking status: Every Day    Types: Cigarettes    Passive exposure: Current   Smokeless tobacco: Not on file  Vaping Use   Vaping status: Never Used  Substance and Sexual Activity   Alcohol use: Never   Drug use: Never   Sexual activity: Not Currently  Other Topics Concern   Not on file  Social History Narrative   Not on file   Social Drivers of Health   Financial Resource Strain: High Risk (05/28/2021)   [facility]   Overall Financial Resource Strain (CARDIA)    Difficulty of Paying Living Expenses: Very hard  Food Insecurity: Not on file  Transportation Needs: Unmet Transportation Needs (05/28/2021)   Received from [facility]   PRAPARE - Transportation    Lack of  Transportation (Medical): Yes    Lack of Transportation (Non-Medical): Yes  Physical Activity: Not on file  Stress: Not on file  Social Connections: Not on file  Intimate Partner Violence: Not on file    Review of Systems  Musculoskeletal:  Positive for back pain.        Objective    BP (!) 142/84 (Cuff Size: Large)   Pulse 96   Temp 98.1 F (36.7 C) (Oral)   Resp 18   Ht 5' 9 (1.753 m)   Wt (!) 325 lb 12.8 oz (147.8 kg)   SpO2 96%   BMI 48.11 kg/m   Physical Exam Constitutional:      Appearance: Normal appearance. She is obese.  HENT:     Head: Normocephalic and atraumatic.     Mouth/Throat:     Mouth: Mucous membranes are moist.     Pharynx: Oropharynx is clear.     Comments: Small airway Eyes:     Conjunctiva/sclera: Conjunctivae normal.  Neck:     Comments: No thyromegaly  Cardiovascular:     Rate and Rhythm: Normal rate and regular rhythm.  Pulmonary:     Effort: Pulmonary effort is normal.     Breath sounds: Normal breath sounds.  Musculoskeletal:     Cervical back: No tenderness.     Right lower leg: Edema present.     Left lower leg: Edema present.  Lymphadenopathy:     Cervical: No cervical adenopathy.  Skin:    General: Skin is warm and dry.  Neurological:     General: No focal deficit present.     Mental Status: She is alert. Mental status is at baseline.  Psychiatric:        Mood and Affect: Mood normal.        Behavior: Behavior normal.     Last CBC Lab Results  Component Value Date   WBC 14.4 (H) 05/16/2021   HGB 12.3 05/16/2021   HCT 39.0 05/16/2021   MCV 78.3 (L) 05/16/2021   MCH 24.7 (L) 05/16/2021   RDW 17.0 (H) 05/16/2021   PLT 429 (H) 05/16/2021   Last metabolic panel Lab Results  Component Value Date   GLUCOSE 121 (H) 05/16/2021   NA 137 05/16/2021   K 4.4 05/16/2021   CL 105 05/16/2021   CO2 22 05/16/2021   BUN 12 05/16/2021   CREATININE 0.86 05/16/2021   GFRNONAA >60 05/16/2021   CALCIUM 9.5 05/16/2021    ANIONGAP 10 05/16/2021   Last lipids No results found for: CHOL, HDL, LDLCALC, LDLDIRECT, TRIG, CHOLHDL Last hemoglobin A1c Lab Results  Component Value Date   HGBA1C 7.2 (A) 03/11/2024   Last thyroid functions No results found for: TSH, T3TOTAL, T4TOTAL, THYROIDAB Last vitamin D No results found for: 25OHVITD2, 25OHVITD3, VD25OH Last vitamin B12 and Folate No results found for: VITAMINB12, FOLATE      Assessment & Plan:   Assessment & Plan Lymphedema of lower extremities Chronic lymphedema with skin changes and hardening, risk of infection if fluid leaks. - Advise elevating legs at night. - Recommend wearing compression stockings. - Suggest visiting [facility] for customized compression stockings.  Pseudotumor cerebri with ventriculoperitoneal shunt Pseudotumor cerebri managed with shunt, no neurologist follow-up due to loss of Medicaid. - Refer to neurology for annual follow-up and shunt management.  Chronic kidney disease stage 2, status post left nephrectomy Chronic kidney disease stage 2, post left nephrectomy for staghorn stone. Requires kidney function assessment before medications. - Order labs to assess current kidney function.  Hypertension Hypertension with BP 142/84 mmHg. Requires kidney function assessment before restarting antihypertensive medication. - Order labs to assess kidney function before prescribing antihypertensive medication.  Type 2 diabetes mellitus, controlled Type 2 diabetes mellitus with A1c 7.2%, controlled without medication. Potential for kidney-protective diabetic medications. - Order labs to assess kidney function before considering diabetic medications.  Asthma Asthma with infrequent use of rescue inhaler. No need for maintenance inhaler. - Prescribe albuterol  inhaler for use every 4-6 hours as needed.  Sciatica and low back pain Sciatica and low back pain post-fall. Previous treatment with steroids,  muscle relaxers, and lidocaine  patches. Avoid NSAIDs due to kidney impact. - Prescribe lidocaine  patches. - Prescribe muscle relaxer. - Avoid NSAIDs until kidney function is assessed.  - Ambulatory referral to Neurology - CBC w/Diff/Platelet - Comprehensive Metabolic Panel (CMET) - POCT HgB A1C - Urine Microalbumin w/creat. ratio - HIV antibody (with reflex) - Hepatitis C Antibody - MM 3D SCREENING MAMMOGRAM BILATERAL BREAST; Future - Ambulatory referral to Gastroenterology - Lipid Profile - Vitamin  D (25 hydroxy) - Vitamin B12 - lidocaine  (HM LIDOCAINE  PATCH) 4 %; Place 1 patch onto the skin daily.  Dispense: 20 patch; Refill: 0 - cyclobenzaprine  (FLEXERIL ) 5 MG tablet; 1 tablet every 8 hours as needed for muscle spasms  Dispense: 30 tablet; Refill: 0 - Tdap vaccine greater than or equal to 7yo IM - Pneumococcal conjugate vaccine 20-valent (Prevnar 20)    Return in about 4 weeks (around 04/08/2024).   Sharyle Fischer, DO

## 2024-03-12 LAB — CBC WITH DIFFERENTIAL/PLATELET
Absolute Lymphocytes: 1960 {cells}/uL (ref 850–3900)
Absolute Monocytes: 557 {cells}/uL (ref 200–950)
Basophils Absolute: 46 {cells}/uL (ref 0–200)
Basophils Relative: 0.4 %
Eosinophils Absolute: 116 {cells}/uL (ref 15–500)
Eosinophils Relative: 1 %
HCT: 41.9 % (ref 35.0–45.0)
Hemoglobin: 13.4 g/dL (ref 11.7–15.5)
MCH: 27.1 pg (ref 27.0–33.0)
MCHC: 32 g/dL (ref 32.0–36.0)
MCV: 84.6 fL (ref 80.0–100.0)
MPV: 10.4 fL (ref 7.5–12.5)
Monocytes Relative: 4.8 %
Neutro Abs: 8920 {cells}/uL — ABNORMAL HIGH (ref 1500–7800)
Neutrophils Relative %: 76.9 %
Platelets: 319 Thousand/uL (ref 140–400)
RBC: 4.95 Million/uL (ref 3.80–5.10)
RDW: 15 % (ref 11.0–15.0)
Total Lymphocyte: 16.9 %
WBC: 11.6 Thousand/uL — ABNORMAL HIGH (ref 3.8–10.8)

## 2024-03-12 LAB — COMPREHENSIVE METABOLIC PANEL WITH GFR
AG Ratio: 1.8 (calc) (ref 1.0–2.5)
ALT: 13 U/L (ref 6–29)
AST: 14 U/L (ref 10–35)
Albumin: 4.1 g/dL (ref 3.6–5.1)
Alkaline phosphatase (APISO): 78 U/L (ref 37–153)
BUN: 11 mg/dL (ref 7–25)
CO2: 28 mmol/L (ref 20–32)
Calcium: 9.3 mg/dL (ref 8.6–10.4)
Chloride: 101 mmol/L (ref 98–110)
Creat: 0.8 mg/dL (ref 0.50–1.03)
Globulin: 2.3 g/dL (ref 1.9–3.7)
Glucose, Bld: 142 mg/dL — ABNORMAL HIGH (ref 65–99)
Potassium: 4.3 mmol/L (ref 3.5–5.3)
Sodium: 138 mmol/L (ref 135–146)
Total Bilirubin: 0.5 mg/dL (ref 0.2–1.2)
Total Protein: 6.4 g/dL (ref 6.1–8.1)
eGFR: 89 mL/min/1.73m2 (ref >=60)

## 2024-03-12 LAB — LIPID PANEL
Cholesterol: 193 mg/dL (ref <200)
HDL: 33 mg/dL — ABNORMAL LOW (ref >=50)
LDL Cholesterol (Calc): 128 mg/dL — ABNORMAL HIGH
Non-HDL Cholesterol (Calc): 160 mg/dL — ABNORMAL HIGH (ref <130)
Total CHOL/HDL Ratio: 5.8 (calc) — ABNORMAL HIGH (ref <5.0)
Triglycerides: 188 mg/dL — ABNORMAL HIGH (ref <150)

## 2024-03-12 LAB — HEPATITIS C ANTIBODY: Hepatitis C Ab: NONREACTIVE

## 2024-03-12 LAB — MICROALBUMIN / CREATININE URINE RATIO
Creatinine, Urine: 184 mg/dL (ref 20–275)
Microalb Creat Ratio: 217 mg/g{creat} — ABNORMAL HIGH (ref <30)
Microalb, Ur: 40 mg/dL

## 2024-03-12 LAB — HIV ANTIBODY (ROUTINE TESTING W REFLEX): HIV 1&2 Ab, 4th Generation: NONREACTIVE

## 2024-03-12 LAB — VITAMIN D 25 HYDROXY (VIT D DEFICIENCY, FRACTURES): Vit D, 25-Hydroxy: 18 ng/mL — ABNORMAL LOW (ref 30–100)

## 2024-03-12 LAB — VITAMIN B12: Vitamin B-12: 268 pg/mL (ref 200–1100)

## 2024-03-14 ENCOUNTER — Ambulatory Visit: Payer: Self-pay | Admitting: Internal Medicine

## 2024-03-14 DIAGNOSIS — E559 Vitamin D deficiency, unspecified: Secondary | ICD-10-CM

## 2024-03-14 MED ORDER — VITAMIN D (ERGOCALCIFEROL) 1.25 MG (50000 UNIT) PO CAPS: 50000.0000 [IU] | ORAL_CAPSULE | ORAL | 0 refills | Status: AC

## 2024-03-15 ENCOUNTER — Other Ambulatory Visit: Payer: Self-pay | Admitting: Internal Medicine

## 2024-03-15 DIAGNOSIS — Z982 Presence of cerebrospinal fluid drainage device: Secondary | ICD-10-CM

## 2024-03-15 DIAGNOSIS — G919 Hydrocephalus, unspecified: Secondary | ICD-10-CM

## 2024-03-15 DIAGNOSIS — E782 Mixed hyperlipidemia: Secondary | ICD-10-CM

## 2024-03-15 MED ORDER — ROSUVASTATIN CALCIUM 10 MG PO TABS: 10.0000 mg | ORAL_TABLET | Freq: Every day | ORAL | 3 refills | Status: AC

## 2024-03-18 ENCOUNTER — Encounter: Payer: Self-pay | Admitting: *Deleted

## 2024-04-05 ENCOUNTER — Ambulatory Visit (INDEPENDENT_AMBULATORY_CARE_PROVIDER_SITE_OTHER): Admitting: Neurosurgery

## 2024-04-05 ENCOUNTER — Encounter: Payer: Self-pay | Admitting: Neurosurgery

## 2024-04-05 VITALS — BP 132/70 | Ht 69.0 in | Wt 322.6 lb

## 2024-04-05 DIAGNOSIS — Z982 Presence of cerebrospinal fluid drainage device: Secondary | ICD-10-CM

## 2024-04-05 DIAGNOSIS — G932 Benign intracranial hypertension: Secondary | ICD-10-CM | POA: Diagnosis not present

## 2024-04-05 NOTE — Progress Notes (Unsigned)
 Assessment : 53 year old lady with a past medical history significant for idiopathic intracranial hypertension for which she had a shunt placed in 2005-05-09.  She does not recall where this was done or by home.  In May 09, 2018, she was having worsening headaches and syncope and she was operated on by Dr. Bluford, neurosurgeon at [facility] who in [place] placed a second shunt [or revised the first 1, it is unclear to me which one of the 2 it is].  Since then she has kept having headaches which are daily in nature though the syncope has resolved.  She was on weight loss medication and medication for her idiopathic intracranial hypertension but after her mother died in 09-May-2021 she went into depression and stopped all her medication.  She also has diabetes and also those medications were discontinued.  Patient also lost insurance since since 2018/05/09, has not seen any neurosurgeon and since 05-09-2021 did not go and see her primary care doctor either.  Recently her daughter got her back onto Medicaid and she establish care with a new primary care doctor who recommended that she comes and sees us  to reestablish care about her condition.  Patient says that she has daily headaches and is blind in her left eye since 2005-05-09.  She lays down and goes to sleep to help with her headaches every day.  She watches her daughters children and is not employed.  She has a continued struggle with Social Security disability to be on disability.  She has not seen any eye doctors either.  She was accompanied by her daughter today.  Plan : I was able to review some of her charts but it is unclear to me what kind of shunt was done in 05-09-2005 or 05-09-2018 and I had a long conversation with her and her daughter about her condition.  I reminded them that her condition is dependent upon her weight and weight loss has to be a foundational element in her treatment.  I recommended that she talks to her primary care doctor to get a plan set up and how she needs  to lose weight.  I gave her some recommendations about what to do and what not to eat.  I would like for her to be evaluated by an ophthalmologist to see how her right eye, the good eye, is doing.  She says that she has blurry vision in that eye and I think establishing baseline for her vision and whether or not she has papilledema would be hard of our evaluation.  Lastly, I will get a CT and shunt series to see what kind of hardware she has and I will see her back thereafter.  The CT scan will be with a 1 mm cut in case we ever need to do surgery.  For now, I would not recommend doing a lumbar puncture.  I will see her back in a few weeks.   Social History   Socioeconomic History   Marital status: Single    Spouse name: Not on file   Number of children: 3   Years of education: Not on file   Highest education level: Not on file  Occupational History   Not on file  Tobacco Use   Smoking status: Every Day    Current packs/day: 0.25    Average packs/day: 0.3 packs/day for 34.7 years (8.7 ttl pk-yrs)    Types: Cigarettes    Start date: 69    Passive exposure: Current   Smokeless tobacco: Not on  file  Vaping Use   Vaping status: Never Used  Substance and Sexual Activity   Alcohol use: Never   Drug use: Never   Sexual activity: Not Currently  Other Topics Concern   Not on file  Social History Narrative   Not on file   Social Drivers of Health   Financial Resource Strain: High Risk (05/28/2021)   [facility]   Overall Financial Resource Strain (CARDIA)    Difficulty of Paying Living Expenses: Very hard  Food Insecurity: Not on file  Transportation Needs: Unmet Transportation Needs (05/28/2021)   Received from [facility]   PRAPARE - Transportation    Lack of Transportation (Medical): Yes    Lack of Transportation (Non-Medical): Yes  Physical Activity: Not on file  Stress: Not on file  Social Connections: Not on file  Intimate Partner Violence: Not  on file    History reviewed. No pertinent family history.  Allergies  Allergen Reactions   Acetazolamide Hives and Dermatitis    Past Medical History:  Diagnosis Date   Anxiety    Asthma    Chronic kidney disease    Diabetes mellitus without complication (HCC)    Hydrocephalus (HCC)    Hypertension     Past Surgical History:  Procedure Laterality Date   TUBAL LIGATION       Physical Exam HENT:     Head: Normocephalic.     Nose: Nose normal.  Eyes:     Pupils: Pupils are equal, round, and reactive to light.  Cardiovascular:     Rate and Rhythm: Normal rate.  Pulmonary:     Effort: Pulmonary effort is normal.  Abdominal:     Comments: Morbid obesity  Musculoskeletal:     Cervical back: Normal range of motion.  Neurological:     Mental Status: She is alert.     Cranial Nerves: Cranial nerve deficit present.     Sensory: Sensation is intact.     Motor: Motor function is intact.     Coordination: Coordination is intact.     Gait: Gait is intact.     Comments: Blind OS        Results for orders placed or performed during the hospital encounter of 04/09/17  CT HEAD WO CONTRAST   Narrative   CLINICAL DATA:  Recent syncopal episodes. History of ventricular shunt.  EXAM: CT HEAD WITHOUT CONTRAST  TECHNIQUE: Contiguous axial images were obtained from the base of the skull through the vertex without intravenous contrast.  COMPARISON:  MRI brain dated July 24, 2016.  FINDINGS: Brain: Unchanged right frontal approach shunt catheter with the tip terminating near the frontal horn of the left lateral ventricle. Unchanged asymmetric appearance of lateral ventricles with slit-like appearance of the left lateral ventricle. Unchanged symmetric prominence of the frontoparietal subarachnoid spaces bilaterally. No evidence of acute infarction, hemorrhage, hydrocephalus, extra-axial collection or mass lesion/mass effect.  Vascular: No hyperdense vessel or  unexpected calcification.  Skull: Normal. Negative for fracture or focal lesion.  Sinuses/Orbits: The bilateral paranasal sinuses and mastoid air cells are clear. The orbits are unremarkable.  Other: None.  IMPRESSION: 1.  No acute intracranial abnormality. 2. Right frontal approach shunt catheter with unchanged asymmetric appearance of the lateral ventricles and slit-like appearance of the left lateral ventricle, which could be related to over shunting. 3. Unchanged symmetric prominence of the frontoparietal subarachnoid spaces bilaterally, which also could be related to over shunting.   Electronically Signed   By: Elsie DASEN  Derry M.D.   On: 04/09/2017 11:05   Results for orders placed or performed during the hospital encounter of 07/24/16  MR Venogram Head   Narrative   CLINICAL DATA:  53 year old female with history of idiopathic intracranial hypertension (pseudotumor cerebri) treated with shunt placement 2006 secondary to visual loss. Headaches since May 2017. Some blurred vision and dizziness. Initial encounter.  EXAM: MRI HEAD WITHOUT CONTRAST  MRV HEAD WITHOUT CONTRAST  TECHNIQUE: Multiplanar, multiecho pulse sequences of the brain and surrounding structures were obtained without intravenous contrast. Angiographic images of the intracranial venous structures were obtained using MRV technique without intravenous contrast.  COMPARISON:  None.  FINDINGS: MRI HEAD WITHOUT CONTRAST FINDINGS  No acute infarct or intracranial hemorrhage.  Shunt catheter traverses the right frontal lobe crossing midline with the tip in the superior aspect of the left lateral ventricle. Asymmetric appearance of the lateral ventricles with slit like appearance of the left lateral ventricle.  Partially empty slightly expanded sella consistent with patient's history of pseudotumor. No flattening of the posterior aspect the globes. Minimal prominence of peri optic spaces.  Prominent  appearance of subarachnoid spaces in a symmetric fashion involving the posterior frontal-parietal and occipital region bilaterally may be related to atrophy or overshunting. Correlation with prior exams would prove helpful with this finding as well. No other findings of intracranial hypotension.  Small area of altered signal intensity within the anterior left frontal white matter of indeterminate etiology. No adjacent calvarial defect to suggest left frontal abnormality is related to prior shunt placement. This may be related to prior ischemia. No surrounding findings to suggest white matter changes of demyelinating process, inflammatory process or migraine headaches.  No intracranial mass lesion noted on this unenhanced exam.  Cervicomedullary junction within normal limits.  Minimal mucosal thickening maxillary sinuses and ethmoid sinus air cells.  Major intracranial vascular structures appear patent. Left vertebral artery may end predominantly in a posterior inferior cerebellar artery distribution.  MRV HEAD WITHOUT CONTRAST FINDINGS  Major dural sinuses are patent.  IMPRESSION: MRI HEAD  No acute infarct or intracranial hemorrhage.  Shunt catheter tip in the superior aspect of the left lateral ventricle. Asymmetric appearance of the lateral ventricles with slit like appearance of the left lateral ventricle. It would be helpful to compared to prior examinations to determine if this represents a change (as slit-like ventricles from over shunting can be symptomatic).  Prominent appearance of subarachnoid spaces in a symmetric fashion involving the posterior frontal-parietal and occipital region bilaterally may be related to atrophy or overshunting. Correlation with prior exams would prove helpful with this finding as well. No other findings of intracranial hypotension.  Partially empty slightly expanded sella consistent with patient's history of pseudotumor. No  flattening of the posterior aspect the globes. Minimal prominence of peri optic spaces.  Small area of altered signal intensity within the anterior left frontal white matter of indeterminate etiology.  MRV HEAD WITHOUT CONTRAST FINDINGS  Major dural sinuses are patent.   Electronically Signed   By: Elspeth Slain M.D.   On: 07/24/2016 13:45   MR BRAIN WO CONTRAST   Narrative   CLINICAL DATA:  53 year old female with history of idiopathic intracranial hypertension (pseudotumor cerebri) treated with shunt placement 2006 secondary to visual loss. Headaches since May 2017. Some blurred vision and dizziness. Initial encounter.  EXAM: MRI HEAD WITHOUT CONTRAST  MRV HEAD WITHOUT CONTRAST  TECHNIQUE: Multiplanar, multiecho pulse sequences of the brain and surrounding structures were obtained without intravenous contrast. Angiographic images of the  intracranial venous structures were obtained using MRV technique without intravenous contrast.  COMPARISON:  None.  FINDINGS: MRI HEAD WITHOUT CONTRAST FINDINGS  No acute infarct or intracranial hemorrhage.  Shunt catheter traverses the right frontal lobe crossing midline with the tip in the superior aspect of the left lateral ventricle. Asymmetric appearance of the lateral ventricles with slit like appearance of the left lateral ventricle.  Partially empty slightly expanded sella consistent with patient's history of pseudotumor. No flattening of the posterior aspect the globes. Minimal prominence of peri optic spaces.  Prominent appearance of subarachnoid spaces in a symmetric fashion involving the posterior frontal-parietal and occipital region bilaterally may be related to atrophy or overshunting. Correlation with prior exams would prove helpful with this finding as well. No other findings of intracranial hypotension.  Small area of altered signal intensity within the anterior left frontal white matter of indeterminate  etiology. No adjacent calvarial defect to suggest left frontal abnormality is related to prior shunt placement. This may be related to prior ischemia. No surrounding findings to suggest white matter changes of demyelinating process, inflammatory process or migraine headaches.  No intracranial mass lesion noted on this unenhanced exam.  Cervicomedullary junction within normal limits.  Minimal mucosal thickening maxillary sinuses and ethmoid sinus air cells.  Major intracranial vascular structures appear patent. Left vertebral artery may end predominantly in a posterior inferior cerebellar artery distribution.  MRV HEAD WITHOUT CONTRAST FINDINGS  Major dural sinuses are patent.  IMPRESSION: MRI HEAD  No acute infarct or intracranial hemorrhage.  Shunt catheter tip in the superior aspect of the left lateral ventricle. Asymmetric appearance of the lateral ventricles with slit like appearance of the left lateral ventricle. It would be helpful to compared to prior examinations to determine if this represents a change (as slit-like ventricles from over shunting can be symptomatic).  Prominent appearance of subarachnoid spaces in a symmetric fashion involving the posterior frontal-parietal and occipital region bilaterally may be related to atrophy or overshunting. Correlation with prior exams would prove helpful with this finding as well. No other findings of intracranial hypotension.  Partially empty slightly expanded sella consistent with patient's history of pseudotumor. No flattening of the posterior aspect the globes. Minimal prominence of peri optic spaces.  Small area of altered signal intensity within the anterior left frontal white matter of indeterminate etiology.  MRV HEAD WITHOUT CONTRAST FINDINGS  Major dural sinuses are patent.   Electronically Signed   By: Elspeth Slain M.D.   On: 07/24/2016 13:45

## 2024-04-12 ENCOUNTER — Telehealth: Payer: Self-pay | Admitting: Pharmacy Technician

## 2024-04-12 ENCOUNTER — Other Ambulatory Visit: Payer: Self-pay

## 2024-04-12 ENCOUNTER — Ambulatory Visit
Admission: RE | Admit: 2024-04-12 | Discharge: 2024-04-12 | Disposition: A | Discharge status: Home / Self Care | Source: Ambulatory Visit | Attending: Internal Medicine | Admitting: Internal Medicine

## 2024-04-12 ENCOUNTER — Ambulatory Visit: Admitting: Internal Medicine

## 2024-04-12 ENCOUNTER — Other Ambulatory Visit (HOSPITAL_COMMUNITY): Payer: Self-pay

## 2024-04-12 ENCOUNTER — Encounter: Payer: Self-pay | Admitting: Internal Medicine

## 2024-04-12 VITALS — BP 130/80 | HR 98 | Temp 97.8°F | Resp 16 | Ht 69.0 in | Wt 324.1 lb

## 2024-04-12 DIAGNOSIS — G4733 Obstructive sleep apnea (adult) (pediatric): Secondary | ICD-10-CM

## 2024-04-12 DIAGNOSIS — E782 Mixed hyperlipidemia: Secondary | ICD-10-CM | POA: Diagnosis not present

## 2024-04-12 DIAGNOSIS — Z1231 Encounter for screening mammogram for malignant neoplasm of breast: Secondary | ICD-10-CM | POA: Insufficient documentation

## 2024-04-12 DIAGNOSIS — K219 Gastro-esophageal reflux disease without esophagitis: Secondary | ICD-10-CM

## 2024-04-12 DIAGNOSIS — Z794 Long term (current) use of insulin: Secondary | ICD-10-CM | POA: Diagnosis not present

## 2024-04-12 DIAGNOSIS — E1142 Type 2 diabetes mellitus with diabetic polyneuropathy: Secondary | ICD-10-CM | POA: Diagnosis not present

## 2024-04-12 DIAGNOSIS — K5900 Constipation, unspecified: Secondary | ICD-10-CM

## 2024-04-12 MED ORDER — TIRZEPATIDE 2.5 MG/0.5ML ~~LOC~~ SOAJ: 2.5000 mg | SUBCUTANEOUS | 0 refills | Status: DC

## 2024-04-12 NOTE — Telephone Encounter (Signed)
 Pharmacy Patient Advocate Encounter  Received notification from [place] MEDICAID that Prior Authorization for Mounjaro  2.5MG /0.5ML auto-injectors  has been DENIED.  Full denial letter will be uploaded to the media tab. See denial reason below.     PA #/Case ID/Reference #: [id number]

## 2024-04-12 NOTE — Progress Notes (Signed)
 Established Patient Office Visit  Subjective    Patient ID: Bianca Anthony, female    DOB: [birth year]  Age: 53 y.o. MRN: [id number]  CC:  Chief Complaint  Patient presents with   Follow-up    4 weeks recheck    HPI Bianca Anthony presents to follow up on chronic medical conditions.  Discussed the use of AI scribe software for clinical note transcription with the patient, who gave verbal consent to proceed.  History of Present Illness Bianca Anthony is a 53 year old female who presents for follow-up on her chronic conditions and recent lab results. She is accompanied by her daughters.  She has diabetes with an A1c of 7.2 and elevated microalbumin levels. She is not on medication for diabetes or kidney protection. Her cholesterol is managed with Crestor , which she started a month ago without issues.  She completed a course of vitamin D  supplementation and plans to continue with an over-the-counter supplement. She has sleep apnea, which is relevant to her weight management.  She experiences occasional acid reflux, particularly with certain foods, and is not on medication for it. Constipation is present, and she uses probiotics but is considering a fiber supplement.  Neuropathy symptoms include numbness in her feet and difficulty managing thick toenails, leading to bleeding. She does not have a podiatrist.  She experiences stress-related headaches and has a mammogram scheduled for today. She is awaiting a GI appointment due to scheduling delays.   Hx of Hydrocephalus/Pseudotumor Cerebri -s/p 2 VP shunts -Now following with Neurosurgery again  Hypertension/OSA: -Medications: nothing currently - had been on Propanolol but not now -Also history of OSA, had sleep study years ago and was given a CPAP but is not currently using  HLD: -Medications: started Crestor  10 mg  -Patient is compliant with above medications and reports no side effects.  -Last lipid panel: Lipid Panel      Component Value Date/Time   CHOL 193 03/11/2024 1502   TRIG 188 (H) 03/11/2024 1502   HDL 33 (L) 03/11/2024 1502   CHOLHDL 5.8 (H) 03/11/2024 1502   LDLCALC 128 (H) 03/11/2024 1502    Diabetes, Type 2: -Last A1c 7.2% 8/25 -Medications: Nothing  -Checking BG at home: not checking -Eye exam: Due, discuss more at follow up -Foot exam: Due, discuss at follow up -Microalbumin: UTD, elevated -Statin: Yes -PNA vaccine: UTD  CKD -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 -Had been following with Nephrology but no longer  Asthma:  -Asthma status: stable -Current Treatments: Albuterol  PRN -Satisfied with current treatment?: yes -Albuterol /rescue inhaler frequency: after going up steps at house -Limitation of activity: yes -Current upper respiratory symptoms: no -Pneumovax: Not up to Date -Influenza: Not up to Date  Health Maintenance: -Blood work UTD -Mammogram scheduled for today -Colon cancer screening: referral placed  Outpatient Encounter Medications as of 04/12/2024  Medication Sig   albuterol  (VENTOLIN  HFA) 108 (90 Base) MCG/ACT inhaler Inhale 1-2 puffs into the lungs every 6 (six) hours as needed for wheezing or shortness of breath.   rosuvastatin  (CRESTOR ) 10 MG tablet Take 1 tablet (10 mg total) by mouth daily.   cyclobenzaprine  (FLEXERIL ) 5 MG tablet 1 tablet every 8 hours as needed for muscle spasms   lidocaine  (HM LIDOCAINE  PATCH) 4 % Place 1 patch onto the skin daily.   Vitamin D , Ergocalciferol , (DRISDOL ) 1.25 MG (50000 UNIT) CAPS capsule Take 1 capsule (50,000 Units total) by mouth every 7 (seven) days. (Patient not taking: Reported on 04/12/2024)  No facility-administered encounter medications on file as of 04/12/2024.    Past Medical History:  Diagnosis Date   Anxiety    Asthma    Chronic kidney disease    Diabetes mellitus without complication (HCC)    Hydrocephalus (HCC)    Hypertension     Past Surgical History:  Procedure Laterality Date    TUBAL LIGATION      No family history on file.  Social History   Socioeconomic History   Marital status: Single    Spouse name: Not on file   Number of children: 3   Years of education: Not on file   Highest education level: Not on file  Occupational History   Not on file  Tobacco Use   Smoking status: Every Day    Current packs/day: 0.25    Average packs/day: 0.3 packs/day for 34.7 years (8.7 ttl pk-yrs)    Types: Cigarettes    Start date: 9    Passive exposure: Current   Smokeless tobacco: Not on file  Vaping Use   Vaping status: Never Used  Substance and Sexual Activity   Alcohol use: Never   Drug use: Never   Sexual activity: Not Currently  Other Topics Concern   Not on file  Social History Narrative   Not on file   Social Drivers of Health   Financial Resource Strain: High Risk (05/28/2021)   Received from [facility]   Overall Financial Resource Strain (CARDIA)    Difficulty of Paying Living Expenses: Very hard  Food Insecurity: Not on file  Transportation Needs: Unmet Transportation Needs (05/28/2021)   [facility]   PRAPARE - Transportation    Lack of Transportation (Medical): Yes    Lack of Transportation (Non-Medical): Yes  Physical Activity: Not on file  Stress: Not on file  Social Connections: Not on file  Intimate Partner Violence: Not on file    Review of Systems  All other systems reviewed and are negative.       Objective    BP 130/80 (Cuff Size: Large)   Pulse 98   Temp 97.8 F (36.6 C) (Oral)   Resp 16   Ht 5' 9 (1.753 m)   Wt (!) 324 lb 1.6 oz (147 kg)   SpO2 98%   BMI 47.86 kg/m   Physical Exam Constitutional:      Appearance: Normal appearance.  HENT:     Head: Normocephalic and atraumatic.  Eyes:     Conjunctiva/sclera: Conjunctivae normal.  Cardiovascular:     Rate and Rhythm: Normal rate and regular rhythm.     Pulses:          Dorsalis pedis pulses are 2+ on the right side and 2+ on  the left side.  Pulmonary:     Effort: Pulmonary effort is normal.     Breath sounds: Normal breath sounds.  Musculoskeletal:     Right foot: Normal range of motion. No deformity, bunion, Charcot foot, foot drop or prominent metatarsal heads.     Left foot: Normal range of motion. No deformity, bunion, Charcot foot, foot drop or prominent metatarsal heads.  Feet:     Right foot:     Protective Sensation: 6 sites tested.  5 sites sensed.     Skin integrity: Skin integrity normal.     Toenail Condition: Right toenails are abnormally thick.     Left foot:     Protective Sensation: 6 sites tested.  3 sites sensed.  Skin integrity: Skin integrity normal.     Toenail Condition: Left toenails are abnormally thick.  Skin:    General: Skin is warm and dry.  Neurological:     General: No focal deficit present.     Mental Status: She is alert. Mental status is at baseline.  Psychiatric:        Mood and Affect: Mood normal.        Behavior: Behavior normal.     Last CBC Lab Results  Component Value Date   WBC 11.6 (H) 03/11/2024   HGB 13.4 03/11/2024   HCT 41.9 03/11/2024   MCV 84.6 03/11/2024   MCH 27.1 03/11/2024   RDW 15.0 03/11/2024   PLT 319 03/11/2024   Last metabolic panel Lab Results  Component Value Date   GLUCOSE 142 (H) 03/11/2024   NA 138 03/11/2024   K 4.3 03/11/2024   CL 101 03/11/2024   CO2 28 03/11/2024   BUN 11 03/11/2024   CREATININE 0.80 03/11/2024   GFRNONAA >60 05/16/2021   CALCIUM  9.3 03/11/2024   PROT 6.4 03/11/2024   BILITOT 0.5 03/11/2024   AST 14 03/11/2024   ALT 13 03/11/2024   ANIONGAP 10 05/16/2021   Last lipids Lab Results  Component Value Date   CHOL 193 03/11/2024   HDL 33 (L) 03/11/2024   LDLCALC 128 (H) 03/11/2024   TRIG 188 (H) 03/11/2024   CHOLHDL 5.8 (H) 03/11/2024   Last hemoglobin A1c Lab Results  Component Value Date   HGBA1C 7.2 (A) 03/11/2024   Last thyroid functions No results found for: TSH, T3TOTAL,  T4TOTAL, THYROIDAB Last vitamin D  Lab Results  Component Value Date   VD25OH 18 (L) 03/11/2024   Last vitamin B12 and Folate Lab Results  Component Value Date   VITAMINB12 268 03/11/2024        Assessment & Plan:   Assessment & Plan  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 Early nephropathy indicated by elevated microalbumin. Neuropathy symptoms include foot numbness. Mounjaro  considered for weight loss, A1c reduction, and potential neuropathy and nephropathy improvement. Discussed Mounjaro  side effects: pancreatitis, acid reflux, nausea, constipation. Emphasized foot care importance. - Prescribe Mounjaro  2.5 mg weekly injection. - Refer to podiatry for foot care. - Perform diabetic foot exam. - Educate on daily foot inspections and protective footwear. - Schedule follow-up in 3-4 weeks after starting Mounjaro .  Obesity Obesity management crucial for overall health and diabetes, hyperlipidemia, and sleep apnea management. Mounjaro  recommended for weight loss and diabetes benefits. Discussed Mounjaro  mechanism and side effects: pancreatitis, gastrointestinal symptoms. Expected weight reduction may improve sleep apnea. - Prescribe Mounjaro  2.5 mg weekly injection. - Discuss dietary modifications and weight loss strategies.  Hyperlipidemia Managed with Crestor , started a month ago. Crestor  expected to lower cholesterol and reduce cardiovascular risk. - Continue Crestor  as prescribed.  Obstructive sleep apnea Weight loss emphasized for sleep apnea management. Mounjaro  expected to aid weight reduction and improve symptoms. - Prescribe Mounjaro  2.5 mg weekly injection.  Constipation Constipation may be exacerbated by Mounjaro . Discussed fiber supplements and probiotics. Advised gradual fiber introduction to prevent bloating. - Recommend fiber supplements such as Benefiber or Metamucil. -  Continue probiotics. - Educate on gradual increase of fiber intake.  Gastroesophageal reflux disease (GERD) Discussed potential triggers and OTC options for flare-ups. - Avoid known dietary triggers. - Use OTC Pepcid or Pepto Bismol for flare-ups.  Vitamin D  deficiency Previously treated with prescription vitamin D . Transitioning to  OTC vitamin D  for maintenance to prevent bone disease. - Take over-the-counter vitamin D  1000 IU daily.   - tirzepatide  (MOUNJARO ) 2.5 MG/0.5ML Pen; Inject 2.5 mg into the skin once a week.  Dispense: 2 mL; Refill: 0 - Ambulatory referral to Podiatry   Return in about 4 weeks (around 05/10/2024) for will call to schedule after starting medication.   Sharyle Fischer, DO

## 2024-04-12 NOTE — Telephone Encounter (Signed)
 Pharmacy Patient Advocate Encounter   Received notification from CoverMyMeds that prior authorization for Mounjaro  2.5MG /0.5ML auto-injectors  is required/requested.   Insurance verification completed.   The patient is insured through [facility] MEDICAID .   Per test claim: PA required; PA submitted to above mentioned insurance via Latent Key/confirmation #/EOC [id number] Status is pending

## 2024-04-15 ENCOUNTER — Telehealth: Payer: Self-pay

## 2024-04-15 ENCOUNTER — Other Ambulatory Visit: Payer: Self-pay | Admitting: Internal Medicine

## 2024-04-15 ENCOUNTER — Other Ambulatory Visit: Payer: Self-pay

## 2024-04-15 DIAGNOSIS — E782 Mixed hyperlipidemia: Secondary | ICD-10-CM

## 2024-04-15 DIAGNOSIS — G4733 Obstructive sleep apnea (adult) (pediatric): Secondary | ICD-10-CM

## 2024-04-15 DIAGNOSIS — E1142 Type 2 diabetes mellitus with diabetic polyneuropathy: Secondary | ICD-10-CM

## 2024-04-15 DIAGNOSIS — M5431 Sciatica, right side: Secondary | ICD-10-CM

## 2024-04-15 DIAGNOSIS — R928 Other abnormal and inconclusive findings on diagnostic imaging of breast: Secondary | ICD-10-CM

## 2024-04-15 DIAGNOSIS — Z1211 Encounter for screening for malignant neoplasm of colon: Secondary | ICD-10-CM

## 2024-04-15 MED ORDER — NA SULFATE-K SULFATE-MG SULF 17.5-3.13-1.6 GM/177ML PO SOLN: 354.0000 mL | Freq: Once | ORAL | 0 refills | Status: AC

## 2024-04-15 NOTE — Telephone Encounter (Signed)
 Copied from CRM #[id number]. Topic: Clinical - Medication Refill >> Apr 15, 2024 11:38 AM Ivette P wrote: Medication: - lidocaine  (HM LIDOCAINE  PATCH) 4 % -cyclobenzaprine  (FLEXERIL ) 5 MG tablet  -Tirzepatide  (MOUNJARO ) 2.5 MG/0.5ML Pen   Has the patient contacted their pharmacy? Yes (Agent: If no, request that the patient contact the pharmacy for the refill. If patient does not wish to contact the pharmacy document the reason why and proceed with request.) (Agent: If yes, when and what did the pharmacy advise?)  This is the patient's preferred pharmacy:  [facility] [address] [place] - [address] [place] [address] Phone: [phone] Fax: [phone]  Is this the correct pharmacy for this prescription? Yes If no, delete pharmacy and type the correct one.   Has the prescription been filled recently? No  Is the patient out of the medication? Yes  Has the patient been seen for an appointment in the last year OR does the patient have an upcoming appointment? Yes  Can we respond through MyChart? No  Agent: Please be advised that Rx refills may take up to 3 business days. We ask that you follow-up with your pharmacy.

## 2024-04-15 NOTE — Telephone Encounter (Signed)
 Gastroenterology Pre-Procedure Review  Request Date: 07/04/2024 Requesting Physician: Dr. Jinny  PATIENT REVIEW QUESTIONS: The patient responded to the following health history questions as indicated:    1. Are you having any GI issues? no 2. Do you have a personal history of Polyps? no 3. Do you have a family history of Colon Cancer or Polyps? no 4. Diabetes Mellitus? yes (currently not taking Mounjaro  but stop dated to hold) 5. Joint replacements in the past 12 months?no 6. Major health problems in the past 3 months?no 7. Any artificial heart valves, MVP, or defibrillator?no    MEDICATIONS & ALLERGIES:    Patient reports the following regarding taking any anticoagulation/antiplatelet therapy:   Plavix, Coumadin, Eliquis, Xarelto, Lovenox, Pradaxa, Brilinta, or Effient? no Aspirin? no  Patient confirms/reports the following medications:  Current Outpatient Medications  Medication Sig Dispense Refill   albuterol  (VENTOLIN  HFA) 108 (90 Base) MCG/ACT inhaler Inhale 1-2 puffs into the lungs every 6 (six) hours as needed for wheezing or shortness of breath. 18 g 2   cyclobenzaprine  (FLEXERIL ) 5 MG tablet 1 tablet every 8 hours as needed for muscle spasms 30 tablet 0   lidocaine  (HM LIDOCAINE  PATCH) 4 % Place 1 patch onto the skin daily. 20 patch 0   rosuvastatin  (CRESTOR ) 10 MG tablet Take 1 tablet (10 mg total) by mouth daily. 90 tablet 3   tirzepatide  (MOUNJARO ) 2.5 MG/0.5ML Pen Inject 2.5 mg into the skin once a week. 2 mL 0   Vitamin D , Ergocalciferol , (DRISDOL ) 1.25 MG (50000 UNIT) CAPS capsule Take 1 capsule (50,000 Units total) by mouth every 7 (seven) days. (Patient not taking: Reported on 04/12/2024) 12 capsule 0   No current facility-administered medications for this visit.    Patient confirms/reports the following allergies:  Allergies  Allergen Reactions   Acetazolamide Hives and Dermatitis    No orders of the defined types were placed in this encounter.   AUTHORIZATION  INFORMATION Primary Insurance: 1D#: Group #:  Secondary Insurance: 1D#: Group #:  SCHEDULE INFORMATION: Date: 07/04/2024 Time: Location: [facility]

## 2024-04-18 NOTE — Telephone Encounter (Signed)
 Requested medication (s) are due for refill today: yes  Requested medication (s) are on the active medication list: yes  Last refill:  03/11/24  Future visit scheduled: yes  Notes to clinic:  Unable to refill per protocol, cannot delegate.      Requested Prescriptions  Pending Prescriptions Disp Refills   tirzepatide  (MOUNJARO ) 2.5 MG/0.5ML Pen 2 mL 0    Sig: Inject 2.5 mg into the skin once a week.     Off-Protocol Failed - 04/18/2024  8:27 AM      Failed - Medication not assigned to a protocol, review manually.      Passed - Valid encounter within last 12 months    Recent Outpatient Visits           6 days ago Type 2 diabetes mellitus with diabetic polyneuropathy, with long-term current use of insulin [facility])   [place] [facility] Bernardo Fend, DO   1 month ago Hydrocephalus, unspecified type [facility])   [place] [facility] Bernardo Fend, DO       Future Appointments             In 3 weeks Bernardo Fend, DO [facility] [place]   In 4 weeks Rosslyn Dino HERO, MD [facility]surgery at [facility]             lidocaine  (HM LIDOCAINE  PATCH) 4 % 20 patch 0    Sig: Place 1 patch onto the skin daily.     Off-Protocol Failed - 04/18/2024  8:27 AM      Failed - Medication not assigned to a protocol, review manually.      Passed - Valid encounter within last 12 months    Recent Outpatient Visits           6 days ago Type 2 diabetes mellitus with diabetic polyneuropathy, with long-term current use of insulin [facility])   [place] Bernardo Fend, DO   1 month ago Hydrocephalus, unspecified type [facility])   [place] [facility] Bernardo Fend, DO       Future Appointments             In 3 weeks Bernardo Fend, DO [facility] [place]   In 4 weeks Rosslyn Dino HERO, MD [facility] Neurosurgery at  [facility]           Analgesics:  Topicals Failed - 04/18/2024  8:27 AM      Failed - Manual Review: Labs are only required if the patient has taken medication for more than 8 weeks.      Passed - PLT in normal range and within 360 days    Platelets  Date Value Ref Range Status  03/11/2024 319 140 - 400 Thousand/uL Final         Passed - HGB in normal range and within 360 days    Hemoglobin  Date Value Ref Range Status  03/11/2024 13.4 11.7 - 15.5 g/dL Final         Passed - HCT in normal range and within 360 days    HCT  Date Value Ref Range Status  03/11/2024 41.9 35.0 - 45.0 % Final         Passed - Cr in normal range and within 360 days    Creat  Date Value Ref Range Status  03/11/2024 0.80 0.50 - 1.03 mg/dL Final   Creatinine, Urine  Date Value Ref Range Status  03/11/2024  184 20 - 275 mg/dL Final         Passed - eGFR is 30 or above and within 360 days    GFR, Estimated  Date Value Ref Range Status  05/16/2021 >60 >60 mL/min Final    Comment:    (NOTE) Calculated using the CKD-EPI Creatinine Equation (2021)    eGFR  Date Value Ref Range Status  03/11/2024 89 > OR = 60 mL/min/1.106m2 Final         Passed - Patient is not pregnant      Passed - Valid encounter within last 12 months    Recent Outpatient Visits           6 days ago Type 2 diabetes mellitus with diabetic polyneuropathy, with long-term current use of insulin [facility])   [place] [facility] Bernardo Fend, DO   1 month ago Hydrocephalus, unspecified type [facility])   [place] [facility] Bernardo Fend, DO       Future Appointments             In 3 weeks Bernardo Fend, DO [facility] [place], [place]   In 4 weeks Rosslyn Dino HERO, MD [facility]surgery at [facility]             cyclobenzaprine  (FLEXERIL ) 5 MG tablet 30 tablet 0    Sig: 1 tablet every 8 hours as needed for muscle spasms     Not Delegated -  Analgesics:  Muscle Relaxants Failed - 04/18/2024  8:27 AM      Failed - This refill cannot be delegated      Passed - Valid encounter within last 6 months    Recent Outpatient Visits           6 days ago Type 2 diabetes mellitus with diabetic polyneuropathy, with long-term current use of insulin [facility])   [place] [facility] Bernardo Fend, DO   1 month ago Hydrocephalus, unspecified type [facility])   [facility] Bernardo Fend, DO       Future Appointments             In 3 weeks Bernardo Fend, DO [facility] [place] [facility] [place]   In 4 weeks Rosslyn, Dino HERO, MD [facility] Neurosurgery at [facility]

## 2024-04-21 ENCOUNTER — Ambulatory Visit
Admission: RE | Admit: 2024-04-21 | Discharge: 2024-04-21 | Disposition: A | Discharge status: Home / Self Care | Source: Ambulatory Visit | Attending: Internal Medicine | Admitting: Internal Medicine

## 2024-04-21 ENCOUNTER — Ambulatory Visit: Payer: Self-pay | Admitting: Internal Medicine

## 2024-04-21 DIAGNOSIS — R928 Other abnormal and inconclusive findings on diagnostic imaging of breast: Secondary | ICD-10-CM | POA: Insufficient documentation

## 2024-04-26 ENCOUNTER — Ambulatory Visit (INDEPENDENT_AMBULATORY_CARE_PROVIDER_SITE_OTHER): Admitting: Podiatry

## 2024-04-26 VITALS — Ht 69.0 in | Wt 324.1 lb

## 2024-04-26 DIAGNOSIS — M79674 Pain in right toe(s): Secondary | ICD-10-CM

## 2024-04-26 DIAGNOSIS — M79675 Pain in left toe(s): Secondary | ICD-10-CM

## 2024-04-26 DIAGNOSIS — B351 Tinea unguium: Secondary | ICD-10-CM

## 2024-04-26 DIAGNOSIS — E119 Type 2 diabetes mellitus without complications: Secondary | ICD-10-CM | POA: Diagnosis not present

## 2024-04-26 NOTE — Progress Notes (Signed)
   Chief Complaint  Patient presents with   Ingrown Toenail    Pt is here due to bilateral great toe ingrowns, states they have been like this for a while and wants to have them removed.    SUBJECTIVE Patient with a history of diabetes mellitus presents to office today complaining of elongated, thickened nails that cause pain while ambulating in shoes.  Patient is unable to trim their own nails. Patient is here for further evaluation and treatment.  Past Medical History:  Diagnosis Date   Anxiety    Asthma    Chronic kidney disease    Diabetes mellitus without complication (HCC)    Hydrocephalus (HCC)    Hypertension     Allergies  Allergen Reactions   Acetazolamide Hives and Dermatitis     OBJECTIVE General Patient is awake, alert, and oriented x 3 and in no acute distress. Derm Skin is dry and supple bilateral. Negative open lesions or macerations. Remaining integument unremarkable. Nails are tender, long, thickened and dystrophic with subungual debris, consistent with onychomycosis, 1-5 bilateral. No signs of infection noted. Vasc chronic lower extremity edema Neuro light touch and protective threshold sensation diminished bilaterally.  Musculoskeletal Exam No symptomatic pedal deformities noted bilateral. Muscular strength within normal limits.  ASSESSMENT 1. Diabetes Mellitus w/ peripheral neuropathy 2.  Pain due to onychomycosis of toenails bilateral  PLAN OF CARE 1. Patient evaluated today.  Comprehensive diabetic foot exam performed today 2. Instructed to maintain good pedal hygiene and foot care. Stressed importance of controlling blood sugar.  3. Mechanical debridement of nails 1-5 bilaterally performed using a nail nipper. Filed with dremel without incident.  4. Return to clinic in 3 mos.     Thresa EMERSON Sar, DPM [facility]  Dr. Thresa EMERSON Sar, DPM    [address] [place], [place] [address]                 Office [phone]  Fax [phone]

## 2024-05-11 ENCOUNTER — Encounter: Payer: Self-pay | Admitting: Internal Medicine

## 2024-05-11 ENCOUNTER — Other Ambulatory Visit (HOSPITAL_COMMUNITY): Payer: Self-pay

## 2024-05-11 ENCOUNTER — Ambulatory Visit: Admitting: Internal Medicine

## 2024-05-11 ENCOUNTER — Telehealth: Payer: Self-pay | Admitting: Pharmacy Technician

## 2024-05-11 ENCOUNTER — Other Ambulatory Visit: Payer: Self-pay

## 2024-05-11 VITALS — BP 124/78 | HR 87 | Temp 98.2°F | Resp 16 | Ht 69.0 in | Wt 327.4 lb

## 2024-05-11 DIAGNOSIS — Z23 Encounter for immunization: Secondary | ICD-10-CM

## 2024-05-11 DIAGNOSIS — G4733 Obstructive sleep apnea (adult) (pediatric): Secondary | ICD-10-CM | POA: Diagnosis not present

## 2024-05-11 DIAGNOSIS — Z1231 Encounter for screening mammogram for malignant neoplasm of breast: Secondary | ICD-10-CM

## 2024-05-11 DIAGNOSIS — Z794 Long term (current) use of insulin: Secondary | ICD-10-CM | POA: Diagnosis not present

## 2024-05-11 DIAGNOSIS — M5431 Sciatica, right side: Secondary | ICD-10-CM

## 2024-05-11 DIAGNOSIS — E782 Mixed hyperlipidemia: Secondary | ICD-10-CM | POA: Diagnosis not present

## 2024-05-11 DIAGNOSIS — E1142 Type 2 diabetes mellitus with diabetic polyneuropathy: Secondary | ICD-10-CM | POA: Diagnosis not present

## 2024-05-11 DIAGNOSIS — N6002 Solitary cyst of left breast: Secondary | ICD-10-CM

## 2024-05-11 MED ORDER — CYCLOBENZAPRINE HCL 5 MG PO TABS: ORAL_TABLET | ORAL | 0 refills | Status: DC

## 2024-05-11 MED ORDER — OZEMPIC (0.25 OR 0.5 MG/DOSE) 2 MG/1.5ML ~~LOC~~ SOPN
0.2500 mg | PEN_INJECTOR | SUBCUTANEOUS | 0 refills | Status: DC
Start: 2024-05-11 — End: 2024-06-13

## 2024-05-11 MED ORDER — LIDOCAINE 4 % EX PTCH: 1.0000 | MEDICATED_PATCH | CUTANEOUS | 0 refills | Status: DC

## 2024-05-11 NOTE — Telephone Encounter (Signed)
 Pharmacy Patient Advocate Encounter   Received notification from CoverMyMeds that prior authorization for Ozempic (0.25 or 0.5 MG/DOSE) 2MG /3ML pen-injectors is required/requested.   Insurance verification completed.   The patient is insured through [facility] MEDICAID.   Per test claim: PA required; PA submitted to above mentioned insurance via Latent Key/confirmation #/EOC [id number] Status is pending

## 2024-05-11 NOTE — Telephone Encounter (Signed)
 Pharmacy Patient Advocate Encounter  Received notification from  MEDICAID that Prior Authorization for Ozempic (0.25 or 0.5 MG/DOSE) 2MG /3ML pen-injectors has been APPROVED from 05/11/24 to 05/11/25. Ran test claim, Copay is $4.00. This test claim was processed through [facility]- copay amounts may vary at other pharmacies due to pharmacy/plan contracts, or as the patient moves through the different stages of their insurance plan.   PA #/Case ID/Reference #: [id number]

## 2024-05-11 NOTE — Progress Notes (Signed)
 Established Patient Office Visit  Subjective    Patient ID: Bianca Anthony, female    DOB: 04/10/1971  Age: 53 y.o. MRN: [id number]  CC:  Chief Complaint  Patient presents with   Diabetes    4 week recheck    HPI [name] presents to follow up on chronic medical conditions. Since our last visit, patient underwent mammogram and breast ultrasounds showing 2 sub-centimeter masses in the inner lower left breast, most likely a cyst. Recommendations include a diagnostic left breast mammogram in 6 months (March 2026).  Discussed the use of AI scribe software for clinical note transcription with the patient, who gave verbal consent to proceed.  History of Present Illness  Bianca Anthony is a 53 year old female who presents for medication management and follow-up.  She is experiencing issues with her current medication regimen, specifically not receiving her prescriptions for a lidocaine  patch and Flexeril . She manages her diabetes with Mounjaro , which she had to pay for out of pocket due to insurance issues.  She underwent a mammogram on September 18th, which identified a spot thought to be a cyst. A colonoscopy is scheduled for December 1st. A CT scan is scheduled for October 10th, ordered without contrast, so no lab work is required prior to the scan. She has an appointment with a neurosurgeon to discuss results the week following the CT scan.   Hx of Hydrocephalus/Pseudotumor Cerebri -s/p 2 VP shunts -Now following with Neurosurgery again  Hypertension/OSA: -Medications: nothing currently - had been on Propanolol but not now -Also history of OSA, had sleep study years ago and was given a CPAP but is not currently using  HLD: -Medications: Crestor  10 mg  -Patient is compliant with above medications and reports no side effects.  -Last lipid panel: Lipid Panel     Component Value Date/Time   CHOL 193 03/11/2024 1502   TRIG 188 (H) 03/11/2024 1502   HDL 33 (L) 03/11/2024  1502   CHOLHDL 5.8 (H) 03/11/2024 1502   LDLCALC 128 (H) 03/11/2024 1502    Diabetes, Type 2: -Last A1c 7.2% 8/25 -Medications: Nothing, tried to prescribe Mounjaro  but not covered  -Checking BG at home: not checking -Eye exam: Due, discuss more at follow up -Foot exam: UTD -Microalbumin: UTD, elevated -Statin: Yes -PNA vaccine: UTD  CKD -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 -Had been following with Nephrology but no longer -Creatinine 0.80, GFR 89 8/25  Asthma:  -Asthma status: stable -Current Treatments: Albuterol  PRN -Satisfied with current treatment?: yes -Albuterol /rescue inhaler frequency: after going up steps at house -Limitation of activity: yes -Current upper respiratory symptoms: no -Pneumovax: Not up to Date -Influenza: Not up to Date  Health Maintenance: -Blood work UTD -Mammogram UTD -Colon cancer screening: referral placed, scheduled for December  Outpatient Encounter Medications as of 05/11/2024  Medication Sig   albuterol  (VENTOLIN  HFA) 108 (90 Base) MCG/ACT inhaler Inhale 1-2 puffs into the lungs every 6 (six) hours as needed for wheezing or shortness of breath.   rosuvastatin  (CRESTOR ) 10 MG tablet Take 1 tablet (10 mg total) by mouth daily.   cyclobenzaprine  (FLEXERIL ) 5 MG tablet 1 tablet every 8 hours as needed for muscle spasms (Patient not taking: Reported on 05/11/2024)   lidocaine  (HM LIDOCAINE  PATCH) 4 % Place 1 patch onto the skin daily. (Patient not taking: Reported on 05/11/2024)   tirzepatide  (MOUNJARO ) 2.5 MG/0.5ML Pen Inject 2.5 mg into the skin once a week. (Patient not taking: Reported on 05/11/2024)  Vitamin D , Ergocalciferol , (DRISDOL ) 1.25 MG (50000 UNIT) CAPS capsule Take 1 capsule (50,000 Units total) by mouth every 7 (seven) days. (Patient not taking: Reported on 04/26/2024)   No facility-administered encounter medications on file as of 05/11/2024.    Past Medical History:  Diagnosis Date   Anxiety    Asthma    Chronic  kidney disease    Diabetes mellitus without complication (HCC)    Hydrocephalus (HCC)    Hypertension     Past Surgical History:  Procedure Laterality Date   TUBAL LIGATION      Family History  Problem Relation Age of Onset   Breast cancer Neg Hx     Social History   Socioeconomic History   Marital status: Single    Spouse name: Not on file   Number of children: 3   Years of education: Not on file   Highest education level: Not on file  Occupational History   Not on file  Tobacco Use   Smoking status: Every Day    Current packs/day: 0.25    Average packs/day: 0.3 packs/day for 34.8 years (8.7 ttl pk-yrs)    Types: Cigarettes    Start date: 74    Passive exposure: Current   Smokeless tobacco: Not on file  Vaping Use   Vaping status: Never Used  Substance and Sexual Activity   Alcohol use: Never   Drug use: Never   Sexual activity: Not Currently  Other Topics Concern   Not on file  Social History Narrative   Not on file   Social Drivers of Health   Financial Resource Strain: High Risk (05/28/2021)   Received from [facility]   Overall Financial Resource Strain (CARDIA)    Difficulty of Paying Living Expenses: Very hard  Food Insecurity: Not on file  Transportation Needs: Unmet Transportation Needs (05/28/2021)   Received from [facility]   PRAPARE - Transportation    Lack of Transportation (Medical): Yes    Lack of Transportation (Non-Medical): Yes  Physical Activity: Not on file  Stress: Not on file  Social Connections: Not on file  Intimate Partner Violence: Not on file    Review of Systems  All other systems reviewed and are negative.       Objective    BP 124/78 (Cuff Size: Large)   Pulse 87   Temp 98.2 F (36.8 C) (Oral)   Resp 16   Ht 5' 9 (1.753 m)   Wt (!) 327 lb 6.4 oz (148.5 kg)   SpO2 96%   BMI 48.35 kg/m   Physical Exam Constitutional:      Appearance: Normal appearance.  HENT:     Head: Normocephalic and  atraumatic.  Eyes:     Conjunctiva/sclera: Conjunctivae normal.  Cardiovascular:     Rate and Rhythm: Normal rate and regular rhythm.  Pulmonary:     Effort: Pulmonary effort is normal.     Breath sounds: Normal breath sounds.  Skin:    General: Skin is warm and dry.  Neurological:     General: No focal deficit present.     Mental Status: She is alert. Mental status is at baseline.  Psychiatric:        Mood and Affect: Mood normal.        Behavior: Behavior normal.     Last CBC Lab Results  Component Value Date   WBC 11.6 (H) 03/11/2024   HGB 13.4 03/11/2024   HCT 41.9 03/11/2024   MCV 84.6 03/11/2024  MCH 27.1 03/11/2024   RDW 15.0 03/11/2024   PLT 319 03/11/2024   Last metabolic panel Lab Results  Component Value Date   GLUCOSE 142 (H) 03/11/2024   NA 138 03/11/2024   K 4.3 03/11/2024   CL 101 03/11/2024   CO2 28 03/11/2024   BUN 11 03/11/2024   CREATININE 0.80 03/11/2024   GFRNONAA >60 05/16/2021   CALCIUM  9.3 03/11/2024   PROT 6.4 03/11/2024   BILITOT 0.5 03/11/2024   AST 14 03/11/2024   ALT 13 03/11/2024   ANIONGAP 10 05/16/2021   Last lipids Lab Results  Component Value Date   CHOL 193 03/11/2024   HDL 33 (L) 03/11/2024   LDLCALC 128 (H) 03/11/2024   TRIG 188 (H) 03/11/2024   CHOLHDL 5.8 (H) 03/11/2024   Last hemoglobin A1c Lab Results  Component Value Date   HGBA1C 7.2 (A) 03/11/2024   Last thyroid functions No results found for: TSH, T3TOTAL, T4TOTAL, THYROIDAB Last vitamin D  Lab Results  Component Value Date   VD25OH 18 (L) 03/11/2024   Last vitamin B12 and Folate Lab Results  Component Value Date   VITAMINB12 268 03/11/2024        Assessment & Plan:   Assessment & Plan  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 Insurance coverage concerns noted. Discussed alternative GLP-1 receptor agonists and potential switch to SGLT2 inhibitors if  necessary. Emphasized A1c management. - [facility] [address]. - If Ozempic is denied by insurance, consider Trulicity or Rybelsus. - If GLP-1s are not covered, consider SGLT2 inhibitors. - Recheck A1c after November 8th. - Hold Ozempic two weeks prior to colonoscopy on December 1st.  Right-sided sciatica Prescriptions for lidocaine  patch and Flexeril  not received by pharmacy. Insurance issues noted. - Resend prescriptions for lidocaine  patch and Flex[facility]il  [facility] [address]. - If medications are not available, contact provider for further assistance.  General health maintenance Mammogram indicated cyst, follow-up required. Colonoscopy and CT scan scheduled. Flu vaccination planned. - Order diagnostic mammogram for left breast in March 2026. - Proceed with colonoscopy on December 1st. - CT scan scheduled for October 10th without contrast. - Administer flu shot during current visit.   - Flu vaccine trivalent PF, 6mos and older(Flulaval,Afluria,Fluarix,Fluzone) - lidocaine  (HM LIDOCAINE  PATCH) 4 %; Place 1 patch onto the skin daily.  Dispense: 20 patch; Refill: 0 - cyclobenzaprine  (FLEXERIL ) 5 MG tablet; 1 tablet every 8 hours as needed for muscle spasms  Dispense: 30 tablet; Refill: 0 - MM 3D DIAGNOSTIC MAMMOGRAM UNILATERAL LEFT BREAST; Future - Semaglutide,0.25 or 0.5MG /DOS, (OZEMPIC, 0.25 OR 0.5 MG/DOSE,) 2 MG/1.5ML SOPN; Inject 0.25 mg into the skin once a week.  Dispense: 2 mL; Refill: 0   Return in about 4 weeks (around 06/08/2024) for follow up on a1c; after 11/8.   Sharyle Fischer, DO

## 2024-05-13 ENCOUNTER — Ambulatory Visit
Admission: RE | Admit: 2024-05-13 | Discharge: 2024-05-13 | Disposition: A | Discharge status: Home / Self Care | Source: Ambulatory Visit | Attending: Neurosurgery | Admitting: Neurosurgery

## 2024-05-13 DIAGNOSIS — G932 Benign intracranial hypertension: Secondary | ICD-10-CM | POA: Insufficient documentation

## 2024-05-17 ENCOUNTER — Ambulatory Visit
Admission: RE | Admit: 2024-05-17 | Discharge: 2024-05-17 | Disposition: A | Discharge status: Home / Self Care | Source: Ambulatory Visit | Attending: Neurosurgery | Admitting: Neurosurgery

## 2024-05-17 ENCOUNTER — Ambulatory Visit (INDEPENDENT_AMBULATORY_CARE_PROVIDER_SITE_OTHER): Admitting: Neurosurgery

## 2024-05-17 ENCOUNTER — Encounter: Payer: Self-pay | Admitting: Neurosurgery

## 2024-05-17 VITALS — BP 132/85 | HR 94 | Temp 97.9°F | Ht 69.0 in | Wt 329.0 lb

## 2024-05-17 DIAGNOSIS — G932 Benign intracranial hypertension: Secondary | ICD-10-CM

## 2024-05-17 DIAGNOSIS — Z982 Presence of cerebrospinal fluid drainage device: Secondary | ICD-10-CM

## 2024-05-17 NOTE — Progress Notes (Signed)
 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  She had a right-sided ventriculoperitoneal shunt placed and was lost to follow-up for many years and return to see me.  I saw her with her daughter a couple weeks ago and ordered a CT scan and shunt series.  This shows that the shunt is intact and the valve setting is approximately at 120.  There are no significant abnormal findings on the CT of the head.  She went and saw her eye doctor but unfortunate did not bring the report and I do not have a copy of it but she says that they said that her right eye did not show any abnormalities.  I do not exactly know what that means in the absence of a formal review by me.  I asked him to go to the eye doctor and get it for us  and bring it with them.  She wants to have a lumbar puncture done and I will order that.  She says that in the past when she has them she has felt better.  I think is a good idea and I will see her back after the lumbar puncture has been done.  This lumbar puncture will have to be measured in the lateral position given her morbid obesity.

## 2024-05-18 ENCOUNTER — Other Ambulatory Visit: Payer: Self-pay | Admitting: Internal Medicine

## 2024-05-18 DIAGNOSIS — N63 Unspecified lump in unspecified breast: Secondary | ICD-10-CM

## 2024-05-18 DIAGNOSIS — N6002 Solitary cyst of left breast: Secondary | ICD-10-CM

## 2024-05-19 ENCOUNTER — Telehealth: Payer: Self-pay

## 2024-05-19 NOTE — Telephone Encounter (Signed)
 Call received from [facility].   Chest Xray completed on 05/17/2024 found Mild soft tissue prominence in the left perihilar region, possibly artifactual; recommend CT chest with contrast for further evaluation.   Report routed in Epic for Dr. Rosslyn to review.

## 2024-05-23 ENCOUNTER — Encounter: Payer: Self-pay | Admitting: Internal Medicine

## 2024-05-23 ENCOUNTER — Ambulatory Visit: Payer: Self-pay | Admitting: Emergency Medicine

## 2024-05-31 NOTE — Discharge Instructions (Signed)

## 2024-06-03 ENCOUNTER — Ambulatory Visit
Admission: RE | Admit: 2024-06-03 | Discharge: 2024-06-03 | Disposition: A | Discharge status: Home / Self Care | Source: Ambulatory Visit | Attending: Neurosurgery | Admitting: Neurosurgery

## 2024-06-03 DIAGNOSIS — G932 Benign intracranial hypertension: Secondary | ICD-10-CM

## 2024-06-13 ENCOUNTER — Ambulatory Visit: Admitting: Internal Medicine

## 2024-06-13 ENCOUNTER — Encounter: Payer: Self-pay | Admitting: Internal Medicine

## 2024-06-13 VITALS — BP 140/82 | HR 100 | Temp 98.0°F | Resp 16 | Ht 69.0 in | Wt 322.9 lb

## 2024-06-13 DIAGNOSIS — M5431 Sciatica, right side: Secondary | ICD-10-CM

## 2024-06-13 DIAGNOSIS — E1142 Type 2 diabetes mellitus with diabetic polyneuropathy: Secondary | ICD-10-CM

## 2024-06-13 DIAGNOSIS — Z794 Long term (current) use of insulin: Secondary | ICD-10-CM

## 2024-06-13 LAB — POCT GLYCOSYLATED HEMOGLOBIN (HGB A1C): Hemoglobin A1C: 7.2 % — AB (ref 4.0–5.6)

## 2024-06-13 MED ORDER — CYCLOBENZAPRINE HCL 10 MG PO TABS: 10.0000 mg | ORAL_TABLET | Freq: Three times a day (TID) | ORAL | 2 refills | Status: AC | PRN

## 2024-06-13 MED ORDER — LIDOCAINE 4 % EX PTCH
1.0000 | MEDICATED_PATCH | CUTANEOUS | 0 refills | Status: AC
Start: 2024-06-13 — End: ?

## 2024-06-13 MED ORDER — SEMAGLUTIDE(0.25 OR 0.5MG/DOS) 2 MG/3ML ~~LOC~~ SOPN: 0.5000 mg | PEN_INJECTOR | SUBCUTANEOUS | 2 refills | Status: AC

## 2024-06-13 NOTE — Progress Notes (Signed)
 Established Patient Office Visit  Subjective    Patient ID: Bianca Anthony, female    DOB: 12-12-1970  Age: 53 y.o. MRN: [id number]  CC:  Chief Complaint  Patient presents with   Medical Management of Chronic Issues    HPI [name] presents to follow up on chronic medical conditions.   Discussed the use of AI scribe software for clinical note transcription with the patient, who gave verbal consent to proceed.  History of Present Illness  Bianca Anthony is a 53 year old female with type 2 diabetes who presents for follow-up on Ozempic treatment.  She has been on Ozempic for four weeks, starting at the lowest dose, with a weight reduction from 327 pounds to 322 pounds. Her HbA1c remains at 7.2%. She takes Ozempic weekly and is on her last dose of the initial prescription without significant side effects. She eats once daily.  She is also taking Crestor , with two doses remaining before a refill is needed. Her insurance covers her medications, but she purchased patches out-of-pocket due to pharmacy issues. She has used Flexeril  effectively but has run out of it, with no excessive fatigue noted.   Hx of Hydrocephalus/Pseudotumor Cerebri -s/p 2 VP shunts -Now following with Neurosurgery again -Recent LP 10/25 with good opening pressure of 13 mmHg, has another appointment later this week   Hypertension/OSA: -Medications: nothing currently - had been on Propanolol but not now -Also history of OSA, had sleep study years ago and was given a CPAP but is not currently using  HLD: -Medications: Crestor  10 mg  -Patient is compliant with above medications and reports no side effects.  -Last lipid panel: Lipid Panel     Component Value Date/Time   CHOL 193 03/11/2024 1502   TRIG 188 (H) 03/11/2024 1502   HDL 33 (L) 03/11/2024 1502   CHOLHDL 5.8 (H) 03/11/2024 1502   LDLCALC 128 (H) 03/11/2024 1502    Diabetes, Type 2: -Last A1c 7.2% 8/25 -Medications: Now on Ozempic 0.25  mg and doing very well, lost about 5 pounds in 4 weeks and tolerating well.   -Checking BG at home: not checking -Eye exam: UTD waiting for records -Foot exam: UTD -Microalbumin: UTD, elevated -Statin: Yes -PNA vaccine: UTD  CKD -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 -Had been following with Nephrology but no longer -Creatinine 0.80, GFR 89 8/25  Asthma:  -Asthma status: stable -Current Treatments: Albuterol  PRN -Satisfied with current treatment?: yes -Albuterol /rescue inhaler frequency: after going up steps at house -Limitation of activity: yes -Current upper respiratory symptoms: no -Pneumovax: Not up to Date -Influenza: Not up to Date  Health Maintenance: -Blood work UTD -Mammogram UTD -Colon cancer screening: referral placed, scheduled for December  Outpatient Encounter Medications as of 06/13/2024  Medication Sig   albuterol  (VENTOLIN  HFA) 108 (90 Base) MCG/ACT inhaler Inhale 1-2 puffs into the lungs every 6 (six) hours as needed for wheezing or shortness of breath.   cyclobenzaprine  (FLEXERIL ) 5 MG tablet 1 tablet every 8 hours as needed for muscle spasms   lidocaine  (HM LIDOCAINE  PATCH) 4 % Place 1 patch onto the skin daily.   rosuvastatin  (CRESTOR ) 10 MG tablet Take 1 tablet (10 mg total) by mouth daily.   Semaglutide,0.25 or 0.5MG /DOS, (OZEMPIC, 0.25 OR 0.5 MG/DOSE,) 2 MG/1.5ML SOPN Inject 0.25 mg into the skin once a week.   Vitamin D , Ergocalciferol , (DRISDOL ) 1.25 MG (50000 UNIT) CAPS capsule Take 1 capsule (50,000 Units total) by mouth every 7 (seven)  days.   No facility-administered encounter medications on file as of 06/13/2024.    Past Medical History:  Diagnosis Date   Anxiety    Asthma    Chronic kidney disease    Diabetes mellitus without complication (HCC)    Hydrocephalus (HCC)    Hypertension     Past Surgical History:  Procedure Laterality Date   TUBAL LIGATION      Family History  Problem Relation Age of Onset   Breast  cancer Neg Hx     Social History   Socioeconomic History   Marital status: Single    Spouse name: Not on file   Number of children: 3   Years of education: Not on file   Highest education level: Not on file  Occupational History   Not on file  Tobacco Use   Smoking status: Every Day    Current packs/day: 0.25    Average packs/day: 0.3 packs/day for 34.9 years (8.7 ttl pk-yrs)    Types: Cigarettes    Start date: 16    Passive exposure: Current   Smokeless tobacco: Not on file  Vaping Use   Vaping status: Never Used  Substance and Sexual Activity   Alcohol use: Never   Drug use: Never   Sexual activity: Not Currently  Other Topics Concern   Not on file  Social History Narrative   Not on file   Social Drivers of Health   Financial Resource Strain: High Risk (05/28/2021)   Received from [facility]   Overall Financial Resource Strain (CARDIA)    Difficulty of Paying Living Expenses: Very hard  Food Insecurity: Not on file  Transportation Needs: Unmet Transportation Needs (05/28/2021)   Received from [facility]   PRAPARE - Transportation    Lack of Transportation (Medical): Yes    Lack of Transportation (Non-Medical): Yes  Physical Activity: Not on file  Stress: Not on file  Social Connections: Not on file  Intimate Partner Violence: Not on file    Review of Systems  Gastrointestinal:  Negative for abdominal pain, constipation, heartburn, nausea and vomiting.  All other systems reviewed and are negative.       Objective    BP (!) 140/82 (Cuff Size: Large)   Pulse 100   Temp 98 F (36.7 C) (Oral)   Resp 16   Ht 5' 9 (1.753 m)   Wt (!) 322 lb 14.4 oz (146.5 kg)   SpO2 99%   BMI 47.68 kg/m   Physical Exam Constitutional:      Appearance: Normal appearance. She is obese.  HENT:     Head: Normocephalic and atraumatic.  Eyes:     Conjunctiva/sclera: Conjunctivae normal.  Cardiovascular:     Rate and Rhythm: Normal rate and regular  rhythm.  Pulmonary:     Effort: Pulmonary effort is normal.     Breath sounds: Normal breath sounds.  Skin:    General: Skin is warm and dry.  Neurological:     General: No focal deficit present.     Mental Status: She is alert. Mental status is at baseline.  Psychiatric:        Mood and Affect: Mood normal.        Behavior: Behavior normal.     Last CBC Lab Results  Component Value Date   WBC 11.6 (H) 03/11/2024   HGB 13.4 03/11/2024   HCT 41.9 03/11/2024   MCV 84.6 03/11/2024   MCH 27.1 03/11/2024   RDW 15.0 03/11/2024  PLT 319 03/11/2024   Last metabolic panel Lab Results  Component Value Date   GLUCOSE 142 (H) 03/11/2024   NA 138 03/11/2024   K 4.3 03/11/2024   CL 101 03/11/2024   CO2 28 03/11/2024   BUN 11 03/11/2024   CREATININE 0.80 03/11/2024   GFRNONAA >60 05/16/2021   CALCIUM  9.3 03/11/2024   PROT 6.4 03/11/2024   BILITOT 0.5 03/11/2024   AST 14 03/11/2024   ALT 13 03/11/2024   ANIONGAP 10 05/16/2021   Last lipids Lab Results  Component Value Date   CHOL 193 03/11/2024   HDL 33 (L) 03/11/2024   LDLCALC 128 (H) 03/11/2024   TRIG 188 (H) 03/11/2024   CHOLHDL 5.8 (H) 03/11/2024   Last hemoglobin A1c Lab Results  Component Value Date   HGBA1C 7.2 (A) 03/11/2024   Last thyroid functions No results found for: TSH, T3TOTAL, T4TOTAL, THYROIDAB Last vitamin D  Lab Results  Component Value Date   VD25OH 18 (L) 03/11/2024   Last vitamin B12 and Folate Lab Results  Component Value Date   VITAMINB12 268 03/11/2024        Assessment & Plan:   Assessment & Plan  Type 2 diabetes mellitus with diabetic polyneuropathy Type 2 diabetes with A1c of 7.2%, stable. Positive weight loss response to Ozempic. Borderline hypertension at 140/82 mmHg. - Increased Ozempic to 0.5 mg weekly. - [facility] [address] - Recheck A1c in three months. - Schedule appointment for February 10th or later for A1c  testing.  Chronic right-sided sciatica Chronic right-sided sciatica. Flexeril  effective but depleted. Lidocaine  patches not dispensed despite coverage. - Refilled Flexeril  with increased dosage and refills. - Sent lidocaine  patch prescription to pharmacy. - Advised to contact eye doctor to expedite notes to neurologist.   - POCT HgB A1C - Semaglutide,0.25 or 0.5MG /DOS, 2 MG/3ML SOPN; Inject 0.5 mg into the skin once a week.  Dispense: 3 mL; Refill: 2 - cyclobenzaprine  (FLEXERIL ) 10 MG tablet; Take 1 tablet (10 mg total) by mouth 3 (three) times daily as needed for muscle spasms.  Dispense: 30 tablet; Refill: 2 - lidocaine  (HM LIDOCAINE  PATCH) 4 %; Place 1 patch onto the skin daily.  Dispense: 20 patch; Refill: 0   Return in about 3 months (around 09/13/2024) for follow up on a1c.   Sharyle Fischer, DO

## 2024-06-16 ENCOUNTER — Inpatient Hospital Stay
Admission: RE | Admit: 2024-06-16 | Discharge: 2024-06-16 | Disposition: A | Payer: Self-pay | Source: Ambulatory Visit | Attending: Neurosurgery | Admitting: Neurosurgery

## 2024-06-16 ENCOUNTER — Other Ambulatory Visit: Payer: Self-pay

## 2024-06-16 DIAGNOSIS — Z049 Encounter for examination and observation for unspecified reason: Secondary | ICD-10-CM

## 2024-06-17 ENCOUNTER — Encounter: Payer: Self-pay | Admitting: Neurosurgery

## 2024-06-17 ENCOUNTER — Ambulatory Visit (INDEPENDENT_AMBULATORY_CARE_PROVIDER_SITE_OTHER): Admitting: Neurosurgery

## 2024-06-17 VITALS — BP 151/90 | HR 103 | Temp 97.9°F | Ht 69.0 in | Wt 323.6 lb

## 2024-06-17 DIAGNOSIS — Z982 Presence of cerebrospinal fluid drainage device: Secondary | ICD-10-CM

## 2024-06-17 DIAGNOSIS — G932 Benign intracranial hypertension: Secondary | ICD-10-CM

## 2024-06-17 NOTE — Progress Notes (Signed)
 53 year old lady with history of idiopathic intracranial hypertension and a right-sided ventriculoperitoneal shunt.  She has been blind in her left eye since the early 2000's.  She had an eye exam and per her report, it did not show any swelling and her vision was good.  At her request we did a lumbar puncture and this showed that she had an opening pressure of 13 cm.  She returns today with her family and I congratulated her with the finding.  Clearly this means that her shunt is working and in the absence of any ocular problems and a normal LP pressure, she is welcome to follow-up with me on an as-needed basis

## 2024-07-02 ENCOUNTER — Other Ambulatory Visit: Payer: Self-pay | Admitting: Internal Medicine

## 2024-07-02 DIAGNOSIS — Z794 Long term (current) use of insulin: Secondary | ICD-10-CM

## 2024-07-04 ENCOUNTER — Telehealth: Payer: Self-pay

## 2024-07-04 MED ORDER — NA SULFATE-K SULFATE-MG SULF 17.5-3.13-1.6 GM/177ML PO SOLN: 1.0000 | Freq: Once | ORAL | 0 refills | Status: AC

## 2024-07-04 NOTE — Telephone Encounter (Signed)
 Colonoscopy was originally scheduled for this morning, however she was not having clear stools so she called to reschedule.  She has been added for tomorrow with Dr Jinny.  Additional prep sent to pharmacy [facility]) Advised to continue with clear liquid diet.  Take prep at noon and 5pm this evening nothing to eat or drink 4 hours prior to procedure.  Thanks,  [place], CMA

## 2024-07-05 ENCOUNTER — Ambulatory Visit: Admitting: Certified Registered"

## 2024-07-05 ENCOUNTER — Ambulatory Visit
Admission: RE | Admit: 2024-07-05 | Discharge: 2024-07-05 | Disposition: A | Attending: Gastroenterology | Admitting: Gastroenterology

## 2024-07-05 ENCOUNTER — Encounter: Admission: RE | Disposition: A | Payer: Self-pay | Source: Home / Self Care | Attending: Gastroenterology

## 2024-07-05 ENCOUNTER — Telehealth: Payer: Self-pay

## 2024-07-05 ENCOUNTER — Encounter: Payer: Self-pay | Admitting: Gastroenterology

## 2024-07-05 ENCOUNTER — Other Ambulatory Visit: Payer: Self-pay

## 2024-07-05 DIAGNOSIS — Z1211 Encounter for screening for malignant neoplasm of colon: Secondary | ICD-10-CM

## 2024-07-05 HISTORY — PX: COLONOSCOPY: SHX5424

## 2024-07-05 SURGERY — COLONOSCOPY
Anesthesia: General

## 2024-07-05 MED ORDER — MIDAZOLAM HCL 2 MG/2ML IJ SOLN
INTRAMUSCULAR | Status: AC
  Filled 2024-07-05: qty 2

## 2024-07-05 MED ORDER — PROPOFOL 10 MG/ML IV BOLUS
INTRAVENOUS | Status: AC
  Filled 2024-07-05: qty 20

## 2024-07-05 MED ORDER — ALBUTEROL SULFATE HFA 108 (90 BASE) MCG/ACT IN AERS
INHALATION_SPRAY | RESPIRATORY_TRACT | Status: AC
  Filled 2024-07-05: qty 6.7

## 2024-07-05 MED ORDER — PROPOFOL 1000 MG/100ML IV EMUL
INTRAVENOUS | Status: AC
  Filled 2024-07-05: qty 100

## 2024-07-05 MED ORDER — NA SULFATE-K SULFATE-MG SULF 17.5-3.13-1.6 GM/177ML PO SOLN: 2.0000 | Freq: Once | ORAL | 0 refills | Status: AC

## 2024-07-05 MED ORDER — SODIUM CHLORIDE 0.9 % IV SOLN: INTRAVENOUS | Status: DC

## 2024-07-05 MED ORDER — GLYCOPYRROLATE 0.2 MG/ML IJ SOLN: INTRAMUSCULAR | Status: DC | PRN

## 2024-07-05 NOTE — Addendum Note (Signed)
 Addended by: JODIE HEADINGS on: 07/05/2024 01:31 PM   Modules accepted: Orders

## 2024-07-05 NOTE — Telephone Encounter (Signed)
 Message received by Terry GRADE. Noelia, RN from the endoscopy unit: hey, need to reschedule mrs Wolfgang came to endo, she took all prep but not clean still brown and cloudy stool.   Called patient to reschedule her colonoscopy and had to leave her a detailed message to call me back.

## 2024-07-05 NOTE — OR Nursing (Signed)
 Need to reschedule patient, still having brown liquid stools. Notified dr jinny and office.

## 2024-07-05 NOTE — Telephone Encounter (Signed)
 Patient called back and she scheduled her colonoscopy to be repeated on 08/30/2024 at [facility] with Dr. Jinny with a 2 day prep. Instructions were explained and patient agreed and understood.

## 2024-07-06 NOTE — Telephone Encounter (Signed)
 Rx 06/13/24 3ml 2RF- too soon Requested Prescriptions  Pending Prescriptions Disp Refills   OZEMPIC , 0.25 OR 0.5 MG/DOSE, 2 MG/3ML SOPN [Pharmacy Med Name: OZEMPIC  0.25-0.5 MG/DOSE PEN]      Sig: INJECT 0.25MG  INTO THE SKIN ONE TIME PER WEEK     Endocrinology:  Diabetes - GLP-1 Receptor Agonists - semaglutide  Failed - 07/06/2024 12:06 PM      Failed - HBA1C in normal range and within 180 days    Hemoglobin A1C  Date Value Ref Range Status  06/13/2024 7.2 (A) 4.0 - 5.6 % Final         Passed - Cr in normal range and within 360 days    Creat  Date Value Ref Range Status  03/11/2024 0.80 0.50 - 1.03 mg/dL Final   Creatinine, Urine  Date Value Ref Range Status  03/11/2024 184 20 - 275 mg/dL Final         Passed - Valid encounter within last 6 months    Recent Outpatient Visits           3 weeks ago Type 2 diabetes mellitus with diabetic polyneuropathy, with long-term current use of insulin [facility])   [place] [facility] Bernardo Fend, DO   1 month ago Type 2 diabetes mellitus with diabetic polyneuropathy, with long-term current use of insulin [facility])   [place] [facility] Bernardo Fend, DO   2 months ago Type 2 diabetes mellitus with diabetic polyneuropathy, with long-term current use of insulin [facility])   [place] Bernardo Fend, DO   3 months ago Hydrocephalus, unspecified type [facility])   [facility] Bernardo Fend, [place]

## 2024-07-18 ENCOUNTER — Emergency Department

## 2024-07-18 ENCOUNTER — Encounter: Payer: Self-pay | Admitting: Intensive Care

## 2024-07-18 ENCOUNTER — Other Ambulatory Visit: Payer: Self-pay

## 2024-07-18 ENCOUNTER — Inpatient Hospital Stay
Admission: EM | Admit: 2024-07-18 | Discharge: 2024-07-21 | DRG: 439 | Disposition: A | Discharge status: Home / Self Care | Attending: Internal Medicine | Admitting: Internal Medicine

## 2024-07-18 DIAGNOSIS — E871 Hypo-osmolality and hyponatremia: Secondary | ICD-10-CM | POA: Diagnosis present

## 2024-07-18 DIAGNOSIS — Z6841 Body Mass Index (BMI) 40.0 and over, adult: Secondary | ICD-10-CM

## 2024-07-18 DIAGNOSIS — I129 Hypertensive chronic kidney disease with stage 1 through stage 4 chronic kidney disease, or unspecified chronic kidney disease: Secondary | ICD-10-CM | POA: Diagnosis present

## 2024-07-18 DIAGNOSIS — Z982 Presence of cerebrospinal fluid drainage device: Secondary | ICD-10-CM

## 2024-07-18 DIAGNOSIS — Z79899 Other long term (current) drug therapy: Secondary | ICD-10-CM

## 2024-07-18 DIAGNOSIS — K851 Biliary acute pancreatitis without necrosis or infection: Secondary | ICD-10-CM | POA: Diagnosis not present

## 2024-07-18 DIAGNOSIS — Z888 Allergy status to other drugs, medicaments and biological substances status: Secondary | ICD-10-CM

## 2024-07-18 DIAGNOSIS — R1010 Upper abdominal pain, unspecified: Secondary | ICD-10-CM | POA: Diagnosis not present

## 2024-07-18 DIAGNOSIS — Z7985 Long-term (current) use of injectable non-insulin antidiabetic drugs: Secondary | ICD-10-CM

## 2024-07-18 DIAGNOSIS — K7460 Unspecified cirrhosis of liver: Secondary | ICD-10-CM | POA: Diagnosis present

## 2024-07-18 DIAGNOSIS — J45909 Unspecified asthma, uncomplicated: Secondary | ICD-10-CM | POA: Diagnosis present

## 2024-07-18 DIAGNOSIS — F17210 Nicotine dependence, cigarettes, uncomplicated: Secondary | ICD-10-CM | POA: Diagnosis present

## 2024-07-18 DIAGNOSIS — G4733 Obstructive sleep apnea (adult) (pediatric): Secondary | ICD-10-CM | POA: Diagnosis present

## 2024-07-18 DIAGNOSIS — R109 Unspecified abdominal pain: Principal | ICD-10-CM | POA: Diagnosis present

## 2024-07-18 DIAGNOSIS — E1122 Type 2 diabetes mellitus with diabetic chronic kidney disease: Secondary | ICD-10-CM | POA: Diagnosis present

## 2024-07-18 DIAGNOSIS — K859 Acute pancreatitis without necrosis or infection, unspecified: Principal | ICD-10-CM | POA: Diagnosis present

## 2024-07-18 DIAGNOSIS — T383X5A Adverse effect of insulin and oral hypoglycemic [antidiabetic] drugs, initial encounter: Secondary | ICD-10-CM | POA: Diagnosis present

## 2024-07-18 DIAGNOSIS — Z905 Acquired absence of kidney: Secondary | ICD-10-CM

## 2024-07-18 DIAGNOSIS — Z91199 Patient's noncompliance with other medical treatment and regimen due to unspecified reason: Secondary | ICD-10-CM

## 2024-07-18 LAB — COMPREHENSIVE METABOLIC PANEL WITH GFR
ALT: 10 U/L (ref 0–44)
AST: 15 U/L (ref 15–41)
Albumin: 3.7 g/dL (ref 3.5–5.0)
Alkaline Phosphatase: 103 U/L (ref 38–126)
Anion gap: 11 (ref 5–15)
BUN: 9 mg/dL (ref 6–20)
CO2: 25 mmol/L (ref 22–32)
Calcium: 9.6 mg/dL (ref 8.9–10.3)
Chloride: 99 mmol/L (ref 98–111)
Creatinine, Ser: 0.81 mg/dL (ref 0.44–1.00)
GFR, Estimated: >60 mL/min (ref >60)
Glucose, Bld: 195 mg/dL — ABNORMAL HIGH (ref 70–99)
Potassium: 4 mmol/L (ref 3.5–5.1)
Sodium: 135 mmol/L (ref 135–145)
Total Bilirubin: 0.5 mg/dL (ref 0.0–1.2)
Total Protein: 7.1 g/dL (ref 6.5–8.1)

## 2024-07-18 LAB — URINALYSIS, ROUTINE W REFLEX MICROSCOPIC
Bilirubin Urine: NEGATIVE
Glucose, UA: NEGATIVE mg/dL
Hgb urine dipstick: NEGATIVE
Ketones, ur: NEGATIVE mg/dL
Leukocytes,Ua: NEGATIVE
Nitrite: NEGATIVE
Protein, ur: NEGATIVE mg/dL
Specific Gravity, Urine: >1.046 — ABNORMAL HIGH (ref 1.005–1.030)
pH: 6 (ref 5.0–8.0)

## 2024-07-18 LAB — CBC
HCT: 41.7 % (ref 36.0–46.0)
Hemoglobin: 13.1 g/dL (ref 12.0–15.0)
MCH: 26.1 pg (ref 26.0–34.0)
MCHC: 31.4 g/dL (ref 30.0–36.0)
MCV: 83.2 fL (ref 80.0–100.0)
Platelets: 358 K/uL (ref 150–400)
RBC: 5.01 MIL/uL (ref 3.87–5.11)
RDW: 15.5 % (ref 11.5–15.5)
WBC: 12.1 K/uL — ABNORMAL HIGH (ref 4.0–10.5)
nRBC: 0 % (ref 0.0–0.2)

## 2024-07-18 LAB — CBG MONITORING, ED: Glucose-Capillary: 160 mg/dL — ABNORMAL HIGH (ref 70–99)

## 2024-07-18 LAB — LIPASE, BLOOD: Lipase: 26 U/L (ref 11–51)

## 2024-07-18 MED ORDER — ENOXAPARIN SODIUM 80 MG/0.8ML IJ SOSY
70.0000 mg | PREFILLED_SYRINGE | INTRAMUSCULAR | Status: DC
  Administered 2024-07-18 – 2024-07-20 (×3): 70 mg via SUBCUTANEOUS
  Filled 2024-07-18 (×3): qty 0.7

## 2024-07-18 MED ORDER — SODIUM CHLORIDE 0.9 % IV BOLUS
1000.0000 mL | Freq: Once | INTRAVENOUS | Status: AC
  Administered 2024-07-18: 19:00:00 1000 mL via INTRAVENOUS

## 2024-07-18 MED ORDER — INSULIN ASPART 100 UNIT/ML IJ SOLN
0.0000 [IU] | Freq: Three times a day (TID) | INTRAMUSCULAR | Status: DC
  Administered 2024-07-19 (×3): 3 [IU] via SUBCUTANEOUS
  Administered 2024-07-20: 08:00:00 2 [IU] via SUBCUTANEOUS
  Administered 2024-07-20 – 2024-07-21 (×3): 5 [IU] via SUBCUTANEOUS
  Administered 2024-07-21: 08:00:00 3 [IU] via SUBCUTANEOUS
  Filled 2024-07-18: qty 3
  Filled 2024-07-18: qty 5
  Filled 2024-07-18: qty 2
  Filled 2024-07-18 (×2): qty 3
  Filled 2024-07-18 (×2): qty 5
  Filled 2024-07-18: qty 3

## 2024-07-18 MED ORDER — MORPHINE SULFATE (PF) 4 MG/ML IV SOLN
4.0000 mg | Freq: Once | INTRAVENOUS | Status: AC
  Administered 2024-07-18: 19:00:00 4 mg via INTRAVENOUS
  Filled 2024-07-18: qty 1

## 2024-07-18 MED ORDER — HYDRALAZINE HCL 20 MG/ML IJ SOLN: 5.0000 mg | Freq: Four times a day (QID) | INTRAMUSCULAR | Status: DC | PRN

## 2024-07-18 MED ORDER — FENTANYL CITRATE (PF) 50 MCG/ML IJ SOSY
50.0000 ug | PREFILLED_SYRINGE | Freq: Once | INTRAMUSCULAR | Status: AC
  Administered 2024-07-18: 15:00:00 50 ug via INTRAVENOUS
  Filled 2024-07-18: qty 1

## 2024-07-18 MED ORDER — BISACODYL 5 MG PO TBEC
5.0000 mg | DELAYED_RELEASE_TABLET | Freq: Every day | ORAL | Status: DC | PRN
  Administered 2024-07-19 – 2024-07-21 (×2): 5 mg via ORAL
  Filled 2024-07-18 (×2): qty 1

## 2024-07-18 MED ORDER — IOHEXOL 300 MG/ML  SOLN
100.0000 mL | Freq: Once | INTRAMUSCULAR | Status: AC | PRN
  Administered 2024-07-18: 16:00:00 100 mL via INTRAVENOUS

## 2024-07-18 MED ORDER — PANTOPRAZOLE SODIUM 40 MG IV SOLR
40.0000 mg | Freq: Every day | INTRAVENOUS | Status: DC
  Administered 2024-07-18 – 2024-07-20 (×3): 40 mg via INTRAVENOUS
  Filled 2024-07-18 (×3): qty 10

## 2024-07-18 MED ORDER — MORPHINE SULFATE (PF) 4 MG/ML IV SOLN
4.0000 mg | Freq: Once | INTRAVENOUS | Status: AC
  Administered 2024-07-18: 17:00:00 4 mg via INTRAVENOUS
  Filled 2024-07-18: qty 1

## 2024-07-18 MED ORDER — MORPHINE SULFATE (PF) 2 MG/ML IV SOLN
2.0000 mg | INTRAVENOUS | Status: DC | PRN
  Administered 2024-07-18 – 2024-07-19 (×4): 2 mg via INTRAVENOUS
  Filled 2024-07-18 (×4): qty 1

## 2024-07-18 MED ORDER — SODIUM CHLORIDE 0.9 % IV SOLN: INTRAVENOUS | Status: AC

## 2024-07-18 NOTE — ED Provider Notes (Signed)
 [facility] Provider Note    Event Date/Time   First MD Initiated Contact with Patient 07/18/24 1357     (approximate)   History   Abdominal Pain   HPI  Bianca Anthony is a 53 y.o. female who presents to the emergency department today because concerns for abdominal pain.  Located in the left upper abdomen it does radiate to her back.  She states that she thinks it started a few days ago when she pulled a muscle however it has been consistent since then.  Makes it hard for her to move around or lie flat.  She denies any nausea or vomiting.  Denies any change in her bowel movements.  No fevers.  No similar pain in the past.     Physical Exam   Triage Vital Signs: ED Triage Vitals  Encounter Vitals Group     BP 07/18/24 1229 (!) 152/100     Girls Systolic BP Percentile --      Girls Diastolic BP Percentile --      Boys Systolic BP Percentile --      Boys Diastolic BP Percentile --      Pulse Rate 07/18/24 1229 96     Resp 07/18/24 1229 16     Temp 07/18/24 1229 97.6 F (36.4 C)     Temp Source 07/18/24 1229 Oral     SpO2 07/18/24 1229 98 %     Weight 07/18/24 1227 (!) 316 lb (143.3 kg)     Height 07/18/24 1227 5' 9 (1.753 m)     Head Circumference --      Peak Flow --      Pain Score 07/18/24 1226 9     Pain Loc --      Pain Education --      Exclude from Growth Chart --     Most recent vital signs: Vitals:   07/18/24 1229  BP: (!) 152/100  Pulse: 96  Resp: 16  Temp: 97.6 F (36.4 C)  SpO2: 98%    General: Awake, alert, oriented. CV:  Good peripheral perfusion. Regular rate and rhythm. Resp:  Normal effort. Lungs clear. Abd:  No distention. Tender to palpation in the left abdomen.  ED Results / Procedures / Treatments   Labs (all labs ordered are listed, but only abnormal results are displayed) Labs Reviewed  COMPREHENSIVE METABOLIC PANEL WITH GFR - Abnormal; Notable for the following components:      Result Value   Glucose,  Bld 195 (*)    All other components within normal limits  CBC - Abnormal; Notable for the following components:   WBC 12.1 (*)    All other components within normal limits  LIPASE, BLOOD  URINALYSIS, ROUTINE W REFLEX MICROSCOPIC     EKG  None   RADIOLOGY None   PROCEDURES:  Critical Care performed: No    MEDICATIONS ORDERED IN ED: Medications - No data to display   IMPRESSION / MDM / ASSESSMENT AND PLAN / ED COURSE  I reviewed the triage vital signs and the nursing notes.                              Differential diagnosis includes, but is not limited to, diverticulitis, gastric ulcer, kidney stone, pyelonephritis  Patient's presentation is most consistent with acute presentation with potential threat to life or bodily function.   Patient presented to the emergency department today because concerns  for left-sided abdominal pain.  On exam patient is tender in that area.  No rash.  Blood work shows very mild leukocytosis.  Will check CT scan to evaluate for intra-abdominal pathology.  Will give pain medication.  Ct scan pending at time of sign out.   FINAL CLINICAL IMPRESSION(S) / ED DIAGNOSES   Abdominal pain       Rx / DC Orders   ED Discharge Orders     None        Note:  This document was prepared using Dragon voice recognition software and may include unintentional dictation errors.    Floy Roberts, MD 07/18/24 [phone]

## 2024-07-18 NOTE — ED Notes (Signed)
Rn attempted IV access x2 without success.

## 2024-07-18 NOTE — ED Notes (Signed)
 Patient transported to CT

## 2024-07-18 NOTE — ED Triage Notes (Signed)
 Patient c/o abdominal pain that started last week and has progressively gotten worse.   Denies N,V,D

## 2024-07-18 NOTE — H&P (Addendum)
 History and Physical    Patient: Bianca Anthony DOB: 1971/04/19 DOA: 07/18/2024 DOS: the patient was seen and examined on 07/18/2024 PCP: Bernardo Fend, DO  Patient coming from: Home  Chief Complaint:  Chief Complaint  Patient presents with   Abdominal Pain   HPI: Bianca Anthony is a 53 y.o. female with medical history significant of morbid obesity with BMI greater than 46,who had been on GLP 1 until 2 weeks ago, hypertension, hydrocephalus s/p shunt, anxiety and depression, tobacco dependence.  Patient presents to the emergency room with 3-day history of intractable abdominal pain.  Pain she describes as epigastric and also in the left lower quadrant.  Pain is constant.  Pain is aggravated with deep inspiration.  Denies any worsening symptoms with diet.  Denies any associated nausea, vomiting, or any change in bowel movement.Denies any fever or chills.  In the ED, patient required 1 dose of fentanyl  and morphine  for pain management.  She denies any use of alcohol.  Denies any prior history of pancreatitis.  CT scan was done with significant findings of a focal area of infiltration around the tail of the pancreas, thought to be possible fat necrosis versus possible focal pancreatitis.  Lipase level however within normal limits.  Mild splenomegaly and probable hepatic cirrhosis was noted.  There were findings of cholelithiasis without any inflammatory changes.  Review of Systems: As mentioned in the history of present illness. All other systems reviewed and are negative. Past Medical History:  Diagnosis Date   Anxiety    Asthma    Chronic kidney disease    Diabetes mellitus without complication (HCC)    Hydrocephalus (HCC)    Hypertension    Past Surgical History:  Procedure Laterality Date   COLONOSCOPY N/A 07/05/2024   Procedure: COLONOSCOPY;  Surgeon: Jinny Carmine, MD;  Location: [facility] ENDOSCOPY;  Service: Endoscopy;  Laterality: N/A;   TUBAL LIGATION      Social History:  reports that she has been smoking cigarettes. She started smoking about 34 years ago. She has a 8.7 pack-year smoking history. She has been exposed to tobacco smoke. She has never used smokeless tobacco. She reports that she does not drink alcohol and does not use drugs.  Allergies[1]  Family History  Problem Relation Age of Onset   Breast cancer Neg Hx     Prior to Admission medications  Medication Sig Start Date End Date Taking? Authorizing Provider  albuterol  (VENTOLIN  HFA) 108 (90 Base) MCG/ACT inhaler Inhale 1-2 puffs into the lungs every 6 (six) hours as needed for wheezing or shortness of breath. 03/11/24  Yes Bernardo Fend, DO  cyclobenzaprine  (FLEXERIL ) 10 MG tablet Take 1 tablet (10 mg total) by mouth 3 (three) times daily as needed for muscle spasms. 06/13/24  Yes Bernardo Fend, DO  lidocaine  (HM LIDOCAINE  PATCH) 4 % Place 1 patch onto the skin daily. 06/13/24  Yes Bernardo Fend, DO  rosuvastatin  (CRESTOR ) 10 MG tablet Take 1 tablet (10 mg total) by mouth daily. 03/15/24  Yes Bernardo Fend, DO  Semaglutide ,0.25 or 0.5MG /DOS, 2 MG/3ML SOPN Inject 0.5 mg into the skin once a week. 06/13/24  Yes Bernardo Fend, DO  Vitamin D , Ergocalciferol , (DRISDOL ) 1.25 MG (50000 UNIT) CAPS capsule Take 1 capsule (50,000 Units total) by mouth every 7 (seven) days. 03/14/24  Yes Bernardo Fend, DO    Physical Exam: Vitals:   07/18/24 1227 07/18/24 1229 07/18/24 1635  BP:  (!) 152/100 (!) 129/95  Pulse:  96 94  Resp:  16 16  Temp:  97.6 F (36.4 C) 97.7 F (36.5 C)  TempSrc:  Oral Oral  SpO2:  98% 100%  Weight: (!) 143.3 kg    Height: 5' 9 (1.753 m)     General: Morbidly obese female who was seen in sitting up in chair.  Crying and moderate distress.  Felt better after opiates were given. HEENT: Head is atraumatic Neck: Short obese neck. Abdomen: Obese, rotund, mild left lower quadrant guarding. Chest: Diminished breath sounds  bilaterally Extremities: Significant for bilateral lower extremity edema/lymphedema Skin: Negative for any new rash. CNS shows no focal deficit. Data Reviewed: Sodium is 135, potassium 4.0, chloride 99, bicarb 25, glucose 195, BUN 9, creatinine 0.81, AST is 15, ALT 10 total cholesterol 128 WBC 12.1, hemoglobin 13, hematocrit 41, platelet count 358 last A1c was 7.2  CT scan shows: 1. Focal inflammatory change around the pancreatic tail, favor fat necrosis with possible focal pancreatitis. No loculated collection. 2. 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 No focal hepatic lesion identified on this noncontrast exam. 3. Cholelithiasis without inflammatory changes. 4. Mild splenomegaly. 5. Status post left nephrectomy.   Assessment and Plan: 53 year old with intractable abdominal pain for the past 3 days.  CT scan with incidental finding of focal changes in the pancreatic tail.  #1.  Intractable epigastric and left-quadrant abdominal pain.  Lipase level well within normal limits making gallstone pancreatitis less likely.  CT scan with findings concerning for focal changes in pancreatic tail.  Likely due to pancreatitis.  Other possibilities include peptic ulcer disease.  Patient will be optimized with IV fluids, IV analgesics and IV Protonix .  Monitor for symptomatic Improvement.  She has a scheduled colonoscopy planned by Dr. Jinny as outpatient.  #2. History of morbid obesity: Patients last dose of Ozempic  was about 2 weeks ago.  #3. History of type 2 diabetes mellitus: Not on any diabetic medications at this time.  Last A1c was 7.2.  Insulin  sliding scale will be initiated.  #4.  History of hydrocephalus status post VP shunt.  Asymptomatic.  #5.  Incidental finding of pancreatic tail lesion.  Will send for CA 19-9.  #6.  Incidental findings of probable hepatic cirrhosis and cholelithiasis.  Splenomegaly also noted.  #7.  Status post left nephrectomy: Renal function remained stable.  #8.  History  of obstructive sleep apnea: Not compliant with CPAP machine at home.   Advance Care Planning:   Code Status: Full Code   Consults: Cardiology  Family Communication:   Severity of Illness: The appropriate patient status for this patient is OBSERVATION. Observation status is judged to be reasonable and necessary in order to provide the required intensity of service to ensure the patient's safety. The patient's presenting symptoms, physical exam findings, and initial radiographic and laboratory data in the context of their medical condition is felt to place them at decreased risk for further clinical deterioration. Furthermore, it is anticipated that the patient will be medically stable for discharge from the hospital within 2 midnights of admission.   Author: Maude MARLA Dart, MD 07/18/2024 7:52 PM  For on call review [email or web address].      [1]  Allergies Allergen Reactions   Acetazolamide Hives and Dermatitis

## 2024-07-18 NOTE — ED Provider Notes (Signed)
 53 year old female who was signed out to me awaiting reevaluation and CT imaging results.  CT imaging demonstrating evidence of what appears to be necrotic pancreatitis.  She still appears to be fairly uncomfortable in the exam room after being given repeated IV narcotics, she has a slight leukocytosis but does not appear to be acutely septic.  Given the presentation will reach out to medicine team for admission for pain control and continued monitoring of the pancreatitis.  Spoke with medicine team who has agreed to evaluate the patient to determine course of further medical management.   Fernand Rossie HERO, MD 07/18/24 [phone]

## 2024-07-19 DIAGNOSIS — Z888 Allergy status to other drugs, medicaments and biological substances status: Secondary | ICD-10-CM | POA: Diagnosis not present

## 2024-07-19 DIAGNOSIS — T383X5A Adverse effect of insulin and oral hypoglycemic [antidiabetic] drugs, initial encounter: Secondary | ICD-10-CM | POA: Diagnosis present

## 2024-07-19 DIAGNOSIS — I129 Hypertensive chronic kidney disease with stage 1 through stage 4 chronic kidney disease, or unspecified chronic kidney disease: Secondary | ICD-10-CM | POA: Diagnosis present

## 2024-07-19 DIAGNOSIS — G4733 Obstructive sleep apnea (adult) (pediatric): Secondary | ICD-10-CM | POA: Diagnosis present

## 2024-07-19 DIAGNOSIS — E871 Hypo-osmolality and hyponatremia: Secondary | ICD-10-CM | POA: Diagnosis present

## 2024-07-19 DIAGNOSIS — J45909 Unspecified asthma, uncomplicated: Secondary | ICD-10-CM | POA: Diagnosis present

## 2024-07-19 DIAGNOSIS — Z7985 Long-term (current) use of injectable non-insulin antidiabetic drugs: Secondary | ICD-10-CM | POA: Diagnosis not present

## 2024-07-19 DIAGNOSIS — K7460 Unspecified cirrhosis of liver: Secondary | ICD-10-CM | POA: Diagnosis present

## 2024-07-19 DIAGNOSIS — F17210 Nicotine dependence, cigarettes, uncomplicated: Secondary | ICD-10-CM | POA: Diagnosis present

## 2024-07-19 DIAGNOSIS — K8531 Drug induced acute pancreatitis with uninfected necrosis: Secondary | ICD-10-CM | POA: Diagnosis not present

## 2024-07-19 DIAGNOSIS — Z91199 Patient's noncompliance with other medical treatment and regimen due to unspecified reason: Secondary | ICD-10-CM | POA: Diagnosis not present

## 2024-07-19 DIAGNOSIS — Z982 Presence of cerebrospinal fluid drainage device: Secondary | ICD-10-CM | POA: Diagnosis not present

## 2024-07-19 DIAGNOSIS — E1122 Type 2 diabetes mellitus with diabetic chronic kidney disease: Secondary | ICD-10-CM | POA: Diagnosis present

## 2024-07-19 DIAGNOSIS — Z79899 Other long term (current) drug therapy: Secondary | ICD-10-CM | POA: Diagnosis not present

## 2024-07-19 DIAGNOSIS — Z6841 Body Mass Index (BMI) 40.0 and over, adult: Secondary | ICD-10-CM | POA: Diagnosis not present

## 2024-07-19 DIAGNOSIS — Z905 Acquired absence of kidney: Secondary | ICD-10-CM | POA: Diagnosis not present

## 2024-07-19 DIAGNOSIS — K859 Acute pancreatitis without necrosis or infection, unspecified: Secondary | ICD-10-CM | POA: Diagnosis present

## 2024-07-19 LAB — GLUCOSE, CAPILLARY
Glucose-Capillary: 162 mg/dL — ABNORMAL HIGH (ref 70–99)
Glucose-Capillary: 165 mg/dL — ABNORMAL HIGH (ref 70–99)
Glucose-Capillary: 174 mg/dL — ABNORMAL HIGH (ref 70–99)

## 2024-07-19 LAB — HEMOGLOBIN A1C
Hgb A1c MFr Bld: 7.8 % — ABNORMAL HIGH (ref 4.8–5.6)
Mean Plasma Glucose: 177.16 mg/dL

## 2024-07-19 LAB — CBC
HCT: 38.2 % (ref 36.0–46.0)
Hemoglobin: 12.5 g/dL (ref 12.0–15.0)
MCH: 26.7 pg (ref 26.0–34.0)
MCHC: 32.7 g/dL (ref 30.0–36.0)
MCV: 81.6 fL (ref 80.0–100.0)
Platelets: 342 K/uL (ref 150–400)
RBC: 4.68 MIL/uL (ref 3.87–5.11)
RDW: 15.7 % — ABNORMAL HIGH (ref 11.5–15.5)
WBC: 13.1 K/uL — ABNORMAL HIGH (ref 4.0–10.5)
nRBC: 0 % (ref 0.0–0.2)

## 2024-07-19 LAB — BASIC METABOLIC PANEL WITH GFR
Anion gap: 12 (ref 5–15)
BUN: 9 mg/dL (ref 6–20)
CO2: 23 mmol/L (ref 22–32)
Calcium: 9 mg/dL (ref 8.9–10.3)
Chloride: 96 mmol/L — ABNORMAL LOW (ref 98–111)
Creatinine, Ser: 0.79 mg/dL (ref 0.44–1.00)
GFR, Estimated: >60 mL/min (ref >60)
Glucose, Bld: 190 mg/dL — ABNORMAL HIGH (ref 70–99)
Potassium: 4 mmol/L (ref 3.5–5.1)
Sodium: 130 mmol/L — ABNORMAL LOW (ref 135–145)

## 2024-07-19 LAB — CBG MONITORING, ED: Glucose-Capillary: 170 mg/dL — ABNORMAL HIGH (ref 70–99)

## 2024-07-19 MED ORDER — OXYCODONE HCL 5 MG PO TABS
10.0000 mg | ORAL_TABLET | Freq: Four times a day (QID) | ORAL | Status: DC | PRN
  Administered 2024-07-19 – 2024-07-21 (×2): 10 mg via ORAL
  Filled 2024-07-19 (×2): qty 2

## 2024-07-19 MED ORDER — CYCLOBENZAPRINE HCL 10 MG PO TABS
5.0000 mg | ORAL_TABLET | Freq: Three times a day (TID) | ORAL | Status: DC | PRN
  Administered 2024-07-19: 20:00:00 5 mg via ORAL
  Filled 2024-07-19: qty 1

## 2024-07-19 MED ORDER — ONDANSETRON HCL 4 MG/2ML IJ SOLN: 4.0000 mg | Freq: Four times a day (QID) | INTRAMUSCULAR | Status: DC | PRN

## 2024-07-19 MED ORDER — FENTANYL CITRATE (PF) 50 MCG/ML IJ SOSY
25.0000 ug | PREFILLED_SYRINGE | Freq: Once | INTRAMUSCULAR | Status: AC
  Administered 2024-07-19: 03:00:00 25 ug via INTRAVENOUS
  Filled 2024-07-19: qty 1

## 2024-07-19 MED ORDER — HYDROMORPHONE HCL 1 MG/ML IJ SOLN
1.0000 mg | INTRAMUSCULAR | Status: DC | PRN
  Administered 2024-07-19 – 2024-07-21 (×8): 1 mg via INTRAVENOUS
  Filled 2024-07-19 (×8): qty 1

## 2024-07-19 NOTE — Plan of Care (Signed)

## 2024-07-19 NOTE — TOC CM/SW Note (Signed)
 Transition of Care [facility]) - Inpatient Brief Assessment   Patient Details  Name: Bianca Anthony MRN: [id number] Date of Birth: Nov 08, 1970  Transition of [facility]) CM/SW Contact:    Corean ONEIDA Haddock, RN Phone Number: 07/19/2024, 9:43 AM   Clinical Narrative:  Transition of Care Department [facility]) has reviewed patient and no TOC needs have been identified at this time.  If new patient transition needs arise, please place a TOC consult.   Transition of Care Asessment: Insurance and Status: Insurance coverage has been reviewed Patient has primary care physician: Yes     Prior/Current Home Services: No current home services Social Drivers of Health Review: SDOH reviewed no interventions necessary Readmission risk has been reviewed: Yes Transition of care needs: no transition of care needs at this time

## 2024-07-19 NOTE — Progress Notes (Signed)
 PROGRESS NOTE    Bianca Anthony  FMW:[id number] DOB: 20-May-1971 DOA: 07/18/2024 PCP: Bernardo Fend, DO    Assessment & Plan:   Principal Problem:   Abdominal pain  Assessment and Plan: Intractable epigastric abdominal pain: likely secondary to pancreatitis. Recently started ozempic  and likely the etiology of the pancreatitis. CT scan show possible fat necrosis w/ possible focal pancreatitis. CA19-9 ordered. Continue on IVFs, zofran  prn & oxy, dilaudid  prn. Has a scheduled colonoscopy planned by Dr. Jinny as outpatient.   Morbid obesity : BMI 46.6. Complicates overall care & prognosis   DM2: fair control, HbA1c 7.2. Continue on SSI w/ accuchecks     Hx of hydrocephalus: s/p VP shunt. No acute issues currently    Probable hepatic cirrhosis:w/ splenomegaly as per CT, incidental finding. Will need to f/u outpatient w/ GI   Hx of left nephrectomy: Cr is stable    OSA: not compliant with CPAP at home        DVT prophylaxis: lovenox  Code Status: full  Family Communication: discussed pt's care w/ pt's family at bedside and answered their questions  Disposition Plan: likely d/c back home   Level of care: Med-Surg  Status is: Inpatient Remains inpatient appropriate because: severity of illness    Consultants:    Procedures:   Antimicrobials:   Subjective: Pt c/o abd pain   Objective: Vitals:   07/19/24 0434 07/19/24 0507 07/19/24 0600 07/19/24 0757  BP: (!) 146/90  127/75 (!) 156/87  Pulse: 82  82 81  Resp: 17  15 16   Temp:  97.6 F (36.4 C)  97.7 F (36.5 C)  TempSrc:  Oral    SpO2: 100%  100% 99%  Weight:      Height:       No intake or output data in the 24 hours ending 07/19/24 0955 Filed Weights   07/18/24 1227  Weight: (!) 143.3 kg    Examination:  General exam: Appears calm and comfortable. Morbid obesity  Respiratory system: decreased breath sounds b/l Cardiovascular system: S1 & S2+. No rubs, gallops or clicks. Gastrointestinal  system: Abdomen is obese, soft and nontender.  Normal bowel sounds heard. Central nervous system: Alert and oriented. Moves all extremities Psychiatry: Judgement and insight appear normal. Flat mood and affect    Data Reviewed: I have personally reviewed following labs and imaging studies  CBC: Recent Labs  Lab 07/18/24 1228 07/19/24 0156  WBC 12.1* 13.1*  HGB 13.1 12.5  HCT 41.7 38.2  MCV 83.2 81.6  PLT 358 342   Basic Metabolic Panel: Recent Labs  Lab 07/18/24 1228 07/19/24 0156  NA 135 130*  K 4.0 4.0  CL 99 96*  CO2 25 23  GLUCOSE 195* 190*  BUN 9 9  CREATININE 0.81 0.79  CALCIUM  9.6 9.0   GFR: Estimated Creatinine Clearance: 127.4 mL/min (by C-G formula based on SCr of 0.79 mg/dL). Liver Function Tests: Recent Labs  Lab 07/18/24 1228  AST 15  ALT 10  ALKPHOS 103  BILITOT 0.5  PROT 7.1  ALBUMIN 3.7   Recent Labs  Lab 07/18/24 1228  LIPASE 26   No results for input(s): AMMONIA in the last 168 hours. Coagulation Profile: No results for input(s): INR, PROTIME in the last 168 hours. Cardiac Enzymes: No results for input(s): CKTOTAL, CKMB, CKMBINDEX, TROPONINI in the last 168 hours. BNP (last 3 results) No results for input(s): PROBNP in the last 8760 hours. HbA1C: No results for input(s): HGBA1C in the last 72 hours. CBG: Recent  Labs  Lab 07/18/24 2208 07/19/24 0721  GLUCAP 160* 170*   Lipid Profile: No results for input(s): CHOL, HDL, LDLCALC, TRIG, CHOLHDL, LDLDIRECT in the last 72 hours. Thyroid Function Tests: No results for input(s): TSH, T4TOTAL, FREET4, T3FREE, THYROIDAB in the last 72 hours. Anemia Panel: No results for input(s): VITAMINB12, FOLATE, FERRITIN, TIBC, IRON, RETICCTPCT in the last 72 hours. Sepsis Labs: No results for input(s): PROCALCITON, LATICACIDVEN in the last 168 hours.  No results found for this or any previous visit (from the past 240 hours).        Radiology Studies: CT ABDOMEN PELVIS W CONTRAST Result Date: 07/18/2024 EXAM: CT ABDOMEN AND PELVIS WITH CONTRAST 07/18/2024 05:03:10 PM TECHNIQUE: CT of the abdomen and pelvis was performed with the administration of 100 mL of iohexol  (OMNIPAQUE ) 300 MG/ML solution. Multiplanar reformatted images are provided for review. Automated exposure control, iterative reconstruction, and/or weight-based adjustment of the mA/kV was utilized to reduce the radiation dose to as low as reasonably achievable. COMPARISON: Comparison with 11/28/2009. CLINICAL HISTORY: left sided abd pain FINDINGS: LOWER CHEST: Lung bases are clear. LIVER: Probable hepatic cirrhosis with nodular contour to the liver and enlarged lateral segment left and caudate lobes. No focal lesions identified on noncontrast imaging. GALLBLADDER AND BILE DUCTS: Cholelithiasis. No gallbladder wall thickening or stranding. No biliary ductal dilatation. SPLEEN: The spleen is mildly enlarged. PANCREAS: Focal area of infiltration around the tail of the pancreas likely represents fat necrosis, possibly focal pancreatitis. No loculated collection. ADRENAL GLANDS: No acute abnormality. KIDNEYS, URETERS AND BLADDER: Surgical absence of the left kidney. The right kidney is unremarkable. No stones in the right kidney or ureter. No hydronephrosis on the right. No perinephric or periureteral stranding on the right. Urinary bladder is unremarkable. GI AND BOWEL: Stomach demonstrates no acute abnormality. There is no bowel obstruction. PERITONEUM AND RETROPERITONEUM: No ascites. No free air. VASCULATURE: Calcification of the aorta. No aneurysm. Aorta is normal in caliber. LYMPH NODES: No lymphadenopathy. REPRODUCTIVE ORGANS: Intrauterine device is present. No abnormal adnexal masses. BONES AND SOFT TISSUES: Degenerative changes in the spine. Ventriculoperitoneal shunt tubing terminates in the pelvis. Scarring in the anterior pelvic wall is unchanged since the prior  study, possibly postoperative. No acute osseous abnormality. No focal soft tissue abnormality. IMPRESSION: 1. Focal inflammatory change around the pancreatic tail, favor fat necrosis with possible focal pancreatitis. No loculated collection. 2. 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 No focal hepatic lesion identified on this noncontrast exam. 3. Cholelithiasis without inflammatory changes. 4. Mild splenomegaly. 5. Status post left nephrectomy. Electronically signed by: Elsie Gravely MD 07/18/2024 06:01 PM EST RP Workstation: [id number]        Scheduled Meds:  enoxaparin  (LOVENOX ) injection  70 mg Subcutaneous Q24H   insulin  aspart  0-15 Units Subcutaneous TID WC   pantoprazole  (PROTONIX ) IV  40 mg Intravenous QHS   Continuous Infusions:  sodium chloride  60 mL/hr at 07/18/24 2157     LOS: 0 days     Anthony CHRISTELLA Pouch, MD [facility] Pager [phone]  If 7PM-7AM, please contact night-coverage [email or web address] 07/19/2024, 9:55 AM

## 2024-07-20 DIAGNOSIS — K8531 Drug induced acute pancreatitis with uninfected necrosis: Secondary | ICD-10-CM | POA: Diagnosis not present

## 2024-07-20 LAB — CBC
HCT: 37.7 % (ref 36.0–46.0)
Hemoglobin: 12 g/dL (ref 12.0–15.0)
MCH: 26.4 pg (ref 26.0–34.0)
MCHC: 31.8 g/dL (ref 30.0–36.0)
MCV: 82.9 fL (ref 80.0–100.0)
Platelets: 329 K/uL (ref 150–400)
RBC: 4.55 MIL/uL (ref 3.87–5.11)
RDW: 15.6 % — ABNORMAL HIGH (ref 11.5–15.5)
WBC: 10.2 K/uL (ref 4.0–10.5)
nRBC: 0 % (ref 0.0–0.2)

## 2024-07-20 LAB — BASIC METABOLIC PANEL WITH GFR
Anion gap: 12 (ref 5–15)
BUN: 9 mg/dL (ref 6–20)
CO2: 22 mmol/L (ref 22–32)
Calcium: 9.2 mg/dL (ref 8.9–10.3)
Chloride: 100 mmol/L (ref 98–111)
Creatinine, Ser: 0.81 mg/dL (ref 0.44–1.00)
GFR, Estimated: >60 mL/min (ref >60)
Glucose, Bld: 178 mg/dL — ABNORMAL HIGH (ref 70–99)
Potassium: 3.9 mmol/L (ref 3.5–5.1)
Sodium: 133 mmol/L — ABNORMAL LOW (ref 135–145)

## 2024-07-20 LAB — GLUCOSE, CAPILLARY
Glucose-Capillary: 149 mg/dL — ABNORMAL HIGH (ref 70–99)
Glucose-Capillary: 231 mg/dL — ABNORMAL HIGH (ref 70–99)
Glucose-Capillary: 223 mg/dL — ABNORMAL HIGH (ref 70–99)
Glucose-Capillary: 218 mg/dL — ABNORMAL HIGH (ref 70–99)

## 2024-07-20 MED ORDER — IPRATROPIUM-ALBUTEROL 0.5-2.5 (3) MG/3ML IN SOLN
3.0000 mL | Freq: Four times a day (QID) | RESPIRATORY_TRACT | Status: DC | PRN
  Administered 2024-07-20: 12:00:00 3 mL via RESPIRATORY_TRACT
  Filled 2024-07-20: qty 3

## 2024-07-20 MED ORDER — ROSUVASTATIN CALCIUM 10 MG PO TABS
10.0000 mg | ORAL_TABLET | Freq: Every day | ORAL | Status: DC
  Administered 2024-07-20 – 2024-07-21 (×2): 10 mg via ORAL
  Filled 2024-07-20 (×2): qty 1

## 2024-07-20 MED ORDER — FUROSEMIDE 10 MG/ML IJ SOLN
40.0000 mg | Freq: Once | INTRAMUSCULAR | Status: AC
  Administered 2024-07-20: 19:00:00 40 mg via INTRAVENOUS
  Filled 2024-07-20: qty 4

## 2024-07-20 NOTE — Progress Notes (Signed)
 PROGRESS NOTE    Bianca Anthony  FMW:[id number] DOB: [birth year] DOA: 07/18/2024 PCP: Bernardo Fend, DO    Assessment & Plan:   Principal Problem:   Abdominal pain  Assessment and Plan: Intractable epigastric abdominal pain: likely secondary to pancreatitis. Recently started ozempic  and likely the etiology of the pancreatitis. CT scan show possible fat necrosis w/ possible focal pancreatitis. CA19-9 is pending. Diet advanced to carb modified. Zofran  prn. Oxy, dilaudid  prn for pain.  Has a scheduled colonoscopy planned by Dr. Jinny as outpatient.   Morbid obesity : BMI 46.6. Complicates overall care & prognosis   DM2: fair control, HbA1c 7.2. Continue on SSI w/ accuchecks    Hx of hydrocephalus: s/p VP shunt. No acute issues currently    Probable hepatic cirrhosis:w/ splenomegaly as per CT, incidental finding. Will need to f/u outpatient w/ GI   Hx of left nephrectomy: Cr is stable    OSA: not compliant with CPAP at home        DVT prophylaxis: lovenox  Code Status: full  Family Communication: discussed pt's care w/ pt's family at bedside and answered their questions  Disposition Plan: likely d/c back home   Level of care: Med-Surg  Status is: Inpatient Remains inpatient appropriate because: severity of illness    Consultants:    Procedures:   Antimicrobials:   Subjective: Pt c/o intermittent abd pain  Objective: Vitals:   07/19/24 1542 07/19/24 2035 07/20/24 0512 07/20/24 0747  BP: 133/67 (!) 148/76 128/69 104/71  Pulse: 83 81 88 84  Resp: 16 16 20 17   Temp: 97.7 F (36.5 C) 98.1 F (36.7 C) 97.6 F (36.4 C) 97.8 F (36.6 C)  TempSrc:    Oral  SpO2: 98% 100% 98% 96%  Weight:      Height:        Intake/Output Summary (Last 24 hours) at 07/20/2024 1007 Last data filed at 07/19/2024 1852 Gross per 24 hour  Intake 480 ml  Output --  Net 480 ml   Filed Weights   07/18/24 1227  Weight: (!) 143.3 kg    Examination:  General exam:  Appears uncomfortable. Morbidly obese Respiratory system: diminished breath sounds b/l  Cardiovascular system: S1/S2+. No rubs or clicks  Gastrointestinal system: Abd is soft, NT, obese & hypoactive bowel sounds Central nervous system: alert & oriented. Moves all extremities Psychiatry: judgement and insight appears at baseline. Flat mood and affect   Data Reviewed: I have personally reviewed following labs and imaging studies  CBC: Recent Labs  Lab 07/18/24 1228 07/19/24 0156 07/20/24 0559  WBC 12.1* 13.1* 10.2  HGB 13.1 12.5 12.0  HCT 41.7 38.2 37.7  MCV 83.2 81.6 82.9  PLT 358 342 329   Basic Metabolic Panel: Recent Labs  Lab 07/18/24 1228 07/19/24 0156 07/20/24 0559  NA 135 130* 133*  K 4.0 4.0 3.9  CL 99 96* 100  CO2 25 23 22   GLUCOSE 195* 190* 178*  BUN 9 9 9   CREATININE 0.81 0.79 0.81  CALCIUM  9.6 9.0 9.2   GFR: Estimated Creatinine Clearance: 125.8 mL/min (by C-G formula based on SCr of 0.81 mg/dL). Liver Function Tests: Recent Labs  Lab 07/18/24 1228  AST 15  ALT 10  ALKPHOS 103  BILITOT 0.5  PROT 7.1  ALBUMIN 3.7   Recent Labs  Lab 07/18/24 1228  LIPASE 26   No results for input(s): AMMONIA in the last 168 hours. Coagulation Profile: No results for input(s): INR, PROTIME in the last 168 hours. Cardiac Enzymes:  No results for input(s): CKTOTAL, CKMB, CKMBINDEX, TROPONINI in the last 168 hours. BNP (last 3 results) No results for input(s): PROBNP in the last 8760 hours. HbA1C: Recent Labs    07/18/24 1230  HGBA1C 7.8*   CBG: Recent Labs  Lab 07/19/24 0721 07/19/24 1117 07/19/24 1622 07/19/24 2136 07/20/24 0746  GLUCAP 170* 162* 165* 174* 149*   Lipid Profile: No results for input(s): CHOL, HDL, LDLCALC, TRIG, CHOLHDL, LDLDIRECT in the last 72 hours. Thyroid Function Tests: No results for input(s): TSH, T4TOTAL, FREET4, T3FREE, THYROIDAB in the last 72 hours. Anemia Panel: No results for  input(s): VITAMINB12, FOLATE, FERRITIN, TIBC, IRON, RETICCTPCT in the last 72 hours. Sepsis Labs: No results for input(s): PROCALCITON, LATICACIDVEN in the last 168 hours.  No results found for this or any previous visit (from the past 240 hours).       Radiology Studies: CT ABDOMEN PELVIS W CONTRAST Result Date: 07/18/2024 EXAM: CT ABDOMEN AND PELVIS WITH CONTRAST 07/18/2024 05:03:10 PM TECHNIQUE: CT of the abdomen and pelvis was performed with the administration of 100 mL of iohexol  (OMNIPAQUE ) 300 MG/ML solution. Multiplanar reformatted images are provided for review. Automated exposure control, iterative reconstruction, and/or weight-based adjustment of the mA/kV was utilized to reduce the radiation dose to as low as reasonably achievable. COMPARISON: Comparison with 11/28/2009. CLINICAL HISTORY: left sided abd pain FINDINGS: LOWER CHEST: Lung bases are clear. LIVER: Probable hepatic cirrhosis with nodular contour to the liver and enlarged lateral segment left and caudate lobes. No focal lesions identified on noncontrast imaging. GALLBLADDER AND BILE DUCTS: Cholelithiasis. No gallbladder wall thickening or stranding. No biliary ductal dilatation. SPLEEN: The spleen is mildly enlarged. PANCREAS: Focal area of infiltration around the tail of the pancreas likely represents fat necrosis, possibly focal pancreatitis. No loculated collection. ADRENAL GLANDS: No acute abnormality. KIDNEYS, URETERS AND BLADDER: Surgical absence of the left kidney. The right kidney is unremarkable. No stones in the right kidney or ureter. No hydronephrosis on the right. No perinephric or periureteral stranding on the right. Urinary bladder is unremarkable. GI AND BOWEL: Stomach demonstrates no acute abnormality. There is no bowel obstruction. PERITONEUM AND RETROPERITONEUM: No ascites. No free air. VASCULATURE: Calcification of the aorta. No aneurysm. Aorta is normal in caliber. LYMPH NODES: No  lymphadenopathy. REPRODUCTIVE ORGANS: Intrauterine device is present. No abnormal adnexal masses. BONES AND SOFT TISSUES: Degenerative changes in the spine. Ventriculoperitoneal shunt tubing terminates in the pelvis. Scarring in the anterior pelvic wall is unchanged since the prior study, possibly postoperative. No acute osseous abnormality. No focal soft tissue abnormality. IMPRESSION: 1. Focal inflammatory change around the pancreatic tail, favor fat necrosis with possible focal pancreatitis. No loculated collection. 2. 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 No focal hepatic lesion identified on this noncontrast exam. 3. Cholelithiasis without inflammatory changes. 4. Mild splenomegaly. 5. Status post left nephrectomy. Electronically signed by: Elsie Gravely MD 07/18/2024 06:01 PM EST RP Workstation: [id number]        Scheduled Meds:  enoxaparin  (LOVENOX ) injection  70 mg Subcutaneous Q24H   insulin  aspart  0-15 Units Subcutaneous TID WC   pantoprazole  (PROTONIX ) IV  40 mg Intravenous QHS   Continuous Infusions:     LOS: 1 day     Anthony CHRISTELLA Pouch, MD [facility] Pager [phone]  If 7PM-7AM, please contact night-coverage [email or web address] 07/20/2024, 10:07 AM

## 2024-07-21 ENCOUNTER — Telehealth: Payer: Self-pay | Admitting: Internal Medicine

## 2024-07-21 DIAGNOSIS — K8531 Drug induced acute pancreatitis with uninfected necrosis: Secondary | ICD-10-CM | POA: Diagnosis not present

## 2024-07-21 LAB — BASIC METABOLIC PANEL WITH GFR
Anion gap: 11 (ref 5–15)
BUN: 9 mg/dL (ref 6–20)
CO2: 28 mmol/L (ref 22–32)
Calcium: 9.1 mg/dL (ref 8.9–10.3)
Chloride: 98 mmol/L (ref 98–111)
Creatinine, Ser: 1.12 mg/dL — ABNORMAL HIGH (ref 0.44–1.00)
GFR, Estimated: 59 mL/min — ABNORMAL LOW (ref >60)
Glucose, Bld: 189 mg/dL — ABNORMAL HIGH (ref 70–99)
Potassium: 4.5 mmol/L (ref 3.5–5.1)
Sodium: 137 mmol/L (ref 135–145)

## 2024-07-21 LAB — GLUCOSE, CAPILLARY
Glucose-Capillary: 161 mg/dL — ABNORMAL HIGH (ref 70–99)
Glucose-Capillary: 246 mg/dL — ABNORMAL HIGH (ref 70–99)

## 2024-07-21 LAB — CA 19-9 (SERIAL): CA 19-9: 11 U/mL (ref 0–35)

## 2024-07-21 MED ORDER — OXYCODONE HCL 10 MG PO TABS: 10.0000 mg | ORAL_TABLET | Freq: Four times a day (QID) | ORAL | 0 refills | Status: AC | PRN

## 2024-07-21 MED ORDER — OXYCODONE HCL 10 MG PO TABS: 10.0000 mg | ORAL_TABLET | Freq: Four times a day (QID) | ORAL | 0 refills | Status: DC | PRN

## 2024-07-21 NOTE — Discharge Summary (Signed)
 Physician Discharge Summary  Bianca Anthony FMW:[id number] DOB: Jun 24, 1971 DOA: 07/18/2024  PCP: Bernardo Fend, DO  Admit date: 07/18/2024 Discharge date: 07/21/2024  Admitted From: home Disposition:  home  Recommendations for Outpatient Follow-up:  Follow up with PCP in 1-2 weeks Will need a referral from PCP for GI/hepatologist for likely liver cirrhosis found on CT  Home Health: no  Equipment/Devices: rolling walker  Discharge Condition: stable CODE STATUS: full  Diet recommendation: Carb Modified    Brief/Interim Summary: HPI was taken from Dr. Marca: Bianca Anthony is a 53 y.o. female with medical history significant of morbid obesity with BMI greater than 46,who had been on GLP 1 until 2 weeks ago, hypertension, hydrocephalus s/p shunt, anxiety and depression, tobacco dependence.  Patient presents to the emergency room with 3-day history of intractable abdominal pain.  Pain she describes as epigastric and also in the left lower quadrant.  Pain is constant.  Pain is aggravated with deep inspiration.  Denies any worsening symptoms with diet.  Denies any associated nausea, vomiting, or any change in bowel movement.Denies any fever or chills.   In the ED, patient required 1 dose of fentanyl  and morphine  for pain management.  She denies any use of alcohol.  Denies any prior history of pancreatitis.  CT scan was done with significant findings of a focal area of infiltration around the tail of the pancreas, thought to be possible fat necrosis versus possible focal pancreatitis.  Lipase level however within normal limits.  Mild splenomegaly and probable hepatic cirrhosis was noted.  There were findings of cholelithiasis without any inflammatory changes.  Discharge Diagnoses:  Principal Problem:   Abdominal pain Intractable epigastric abdominal pain: likely secondary to pancreatitis. Recently started ozempic  and likely the etiology of the pancreatitis. CT scan show possible fat  necrosis w/ possible focal pancreatitis. CA19-9 is 11. Diet advanced to carb modified. Zofran  prn. Oxy, dilaudid  prn for pain.  Has a scheduled colonoscopy planned by Dr. Jinny as outpatient.   Morbid obesity : BMI 46.6. Complicates overall care & prognosis   DM2: fair control, HbA1c 7.2. Continue on SSI w/ accuchecks    Hx of hydrocephalus: s/p VP shunt. No acute issues currently    Probable hepatic cirrhosis:w/ splenomegaly as per CT, incidental finding. Will need to f/u outpatient w/ GI. Pt verbalized her understanding   Hx of left nephrectomy: Cr is stable    OSA: not compliant with CPAP at home    Discharge Instructions  Discharge Instructions     Diet Carb Modified   Complete by: As directed    Discharge instructions   Complete by: As directed    F/u w/ PCP in 1-2 weeks. Will need to get a referral from your PCP to see GI/hepatologist for incidental finding of liver cirrhosis on CT scan.   Increase activity slowly   Complete by: As directed       Allergies as of 07/21/2024       Reactions   Acetazolamide Hives, Dermatitis        Medication List     TAKE these medications    albuterol  108 (90 Base) MCG/ACT inhaler Commonly known as: VENTOLIN  HFA Inhale 1-2 puffs into the lungs every 6 (six) hours as needed for wheezing or shortness of breath.   cyclobenzaprine  10 MG tablet Commonly known as: FLEXERIL  Take 1 tablet (10 mg total) by mouth 3 (three) times daily as needed for muscle spasms.   lidocaine  4 % Commonly known as: HM Lidocaine  Patch  Place 1 patch onto the skin daily.   Oxycodone  HCl 10 MG Tabs Take 1 tablet (10 mg total) by mouth every 6 (six) hours as needed for up to 5 days for moderate pain (pain score 4-6) or severe pain (pain score 7-10).   rosuvastatin  10 MG tablet Commonly known as: Crestor  Take 1 tablet (10 mg total) by mouth daily.   Semaglutide (0.25 or 0.5MG /DOS) 2 MG/3ML Sopn Inject 0.5 mg into the skin once a week.   Vitamin D   (Ergocalciferol ) 1.25 MG (50000 UNIT) Caps capsule Commonly known as: DRISDOL  Take 1 capsule (50,000 Units total) by mouth every 7 (seven) days.               Durable Medical Equipment  (From admission, onward)           Start     Ordered   07/21/24 1126  For home use only DME Walker rolling  Once       Comments: Bariatric  Question Answer Comment  Walker: With 5 Inch Wheels   Patient needs a walker to treat with the following condition Weakness      07/21/24 1135            Allergies[1]  Consultations:   Procedures/Studies: CT ABDOMEN PELVIS W CONTRAST Result Date: 07/18/2024 EXAM: CT ABDOMEN AND PELVIS WITH CONTRAST 07/18/2024 05:03:10 PM TECHNIQUE: CT of the abdomen and pelvis was performed with the administration of 100 mL of iohexol  (OMNIPAQUE ) 300 MG/ML solution. Multiplanar reformatted images are provided for review. Automated exposure control, iterative reconstruction, and/or weight-based adjustment of the mA/kV was utilized to reduce the radiation dose to as low as reasonably achievable. COMPARISON: Comparison with 11/28/2009. CLINICAL HISTORY: left sided abd pain FINDINGS: LOWER CHEST: Lung bases are clear. LIVER: Probable hepatic cirrhosis with nodular contour to the liver and enlarged lateral segment left and caudate lobes. No focal lesions identified on noncontrast imaging. GALLBLADDER AND BILE DUCTS: Cholelithiasis. No gallbladder wall thickening or stranding. No biliary ductal dilatation. SPLEEN: The spleen is mildly enlarged. PANCREAS: Focal area of infiltration around the tail of the pancreas likely represents fat necrosis, possibly focal pancreatitis. No loculated collection. ADRENAL GLANDS: No acute abnormality. KIDNEYS, URETERS AND BLADDER: Surgical absence of the left kidney. The right kidney is unremarkable. No stones in the right kidney or ureter. No hydronephrosis on the right. No perinephric or periureteral stranding on the right. Urinary bladder is  unremarkable. GI AND BOWEL: Stomach demonstrates no acute abnormality. There is no bowel obstruction. PERITONEUM AND RETROPERITONEUM: No ascites. No free air. VASCULATURE: Calcification of the aorta. No aneurysm. Aorta is normal in caliber. LYMPH NODES: No lymphadenopathy. REPRODUCTIVE ORGANS: Intrauterine device is present. No abnormal adnexal masses. BONES AND SOFT TISSUES: Degenerative changes in the spine. Ventriculoperitoneal shunt tubing terminates in the pelvis. Scarring in the anterior pelvic wall is unchanged since the prior study, possibly postoperative. No acute osseous abnormality. No focal soft tissue abnormality. IMPRESSION: 1. Focal inflammatory change around the pancreatic tail, favor fat necrosis with possible focal pancreatitis. No loculated collection. 2. 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 No focal hepatic lesion identified on this noncontrast exam. 3. Cholelithiasis without inflammatory changes. 4. Mild splenomegaly. 5. Status post left nephrectomy. Electronically signed by: Elsie Gravely MD 07/18/2024 06:01 PM EST RP Workstation: [id number]   (Echo, Carotid, EGD, Colonoscopy, ERCP)    Subjective: pt c/o fatigue   Discharge Exam: Vitals:   07/21/24 0508 07/21/24 0755  BP: 123/66 127/64  Pulse: 79 80  Resp: 20 20  Temp: (!) 97.4  F (36.3 C) 97.6 F (36.4 C)  SpO2: 100% 100%   Vitals:   07/20/24 1601 07/20/24 1945 07/21/24 0508 07/21/24 0755  BP: 128/79 132/82 123/66 127/64  Pulse: 98 91 79 80  Resp: 16 17 20 20   Temp: (!) 97.5 F (36.4 C) 97.6 F (36.4 C) (!) 97.4 F (36.3 C) 97.6 F (36.4 C)  TempSrc:   Axillary   SpO2: 95% 100% 100% 100%  Weight:      Height:        General: Pt is alert, awake, not in acute distress Cardiovascular: S1/S2 +, no rubs, no gallops Respiratory: decreased breath sounds b/l  Abdominal: Soft, NT, obese, bowel sounds + Extremities:  no cyanosis    The results of significant diagnostics from this hospitalization (including imaging,  microbiology, ancillary and laboratory) are listed below for reference.     Microbiology: No results found for this or any previous visit (from the past 240 hours).   Labs: BNP (last 3 results) No results for input(s): BNP in the last 8760 hours. Basic Metabolic Panel: Recent Labs  Lab 07/18/24 1228 07/19/24 0156 07/20/24 0559 07/21/24 0408  NA 135 130* 133* 137  K 4.0 4.0 3.9 4.5  CL 99 96* 100 98  CO2 25 23 22 28   GLUCOSE 195* 190* 178* 189*  BUN 9 9 9 9   CREATININE 0.81 0.79 0.81 1.12*  CALCIUM  9.6 9.0 9.2 9.1   Liver Function Tests: Recent Labs  Lab 07/18/24 1228  AST 15  ALT 10  ALKPHOS 103  BILITOT 0.5  PROT 7.1  ALBUMIN 3.7   Recent Labs  Lab 07/18/24 1228  LIPASE 26   No results for input(s): AMMONIA in the last 168 hours. CBC: Recent Labs  Lab 07/18/24 1228 07/19/24 0156 07/20/24 0559  WBC 12.1* 13.1* 10.2  HGB 13.1 12.5 12.0  HCT 41.7 38.2 37.7  MCV 83.2 81.6 82.9  PLT 358 342 329   Cardiac Enzymes: No results for input(s): CKTOTAL, CKMB, CKMBINDEX, TROPONINI in the last 168 hours. BNP: Invalid input(s): POCBNP CBG: Recent Labs  Lab 07/20/24 1142 07/20/24 1702 07/20/24 2140 07/21/24 0756 07/21/24 1157  GLUCAP 231* 223* 218* 161* 246*   D-Dimer No results for input(s): DDIMER in the last 72 hours. Hgb A1c No results for input(s): HGBA1C in the last 72 hours. Lipid Profile No results for input(s): CHOL, HDL, LDLCALC, TRIG, CHOLHDL, LDLDIRECT in the last 72 hours. Thyroid function studies No results for input(s): TSH, T4TOTAL, T3FREE, THYROIDAB in the last 72 hours.  Invalid input(s): FREET3 Anemia work up No results for input(s): VITAMINB12, FOLATE, FERRITIN, TIBC, IRON, RETICCTPCT in the last 72 hours. Urinalysis    Component Value Date/Time   COLORURINE YELLOW (A) 07/18/2024 1838   APPEARANCEUR CLEAR (A) 07/18/2024 1838   LABSPEC >1.046 (H) 07/18/2024 1838   PHURINE 6.0  07/18/2024 1838   GLUCOSEU NEGATIVE 07/18/2024 1838   HGBUR NEGATIVE 07/18/2024 1838   BILIRUBINUR NEGATIVE 07/18/2024 1838   KETONESUR NEGATIVE 07/18/2024 1838   PROTEINUR NEGATIVE 07/18/2024 1838   NITRITE NEGATIVE 07/18/2024 1838   LEUKOCYTESUR NEGATIVE 07/18/2024 1838   Sepsis Labs Recent Labs  Lab 07/18/24 1228 07/19/24 0156 07/20/24 0559  WBC 12.1* 13.1* 10.2   Microbiology No results found for this or any previous visit (from the past 240 hours).   Time coordinating discharge: 36  minutes  SIGNED:   Anthony CHRISTELLA Pouch, MD  [facility] 07/21/2024, 1:18 PM Pager   If 7PM-7AM, please contact night-coverage [email or web address]      [  1]  Allergies Allergen Reactions   Acetazolamide Hives and Dermatitis

## 2024-07-21 NOTE — Telephone Encounter (Signed)
 Patient's daughter called and she says the patient was discharged today with oxycodone . It is out of stock and will need to be resent to [facility] in [place]. She says she was told to contact the one who prescribed it, but there is no way for her to call that provider. Advised I will message the provider, Dr. Trudy. Dr. Trudy was able to resend the medication.     Copied from CRM #[id number]. Topic: Clinical - Prescription Issue >> Jul 21, 2024  2:30 PM Bianca Anthony wrote: Reason for CRM: Patient's daughter is calling in with patient because the pharmacy says they don't have oxyCODONE  10 MG TABS [[id number]] in stock. They are requesting it be sent to [facility] in [place].

## 2024-07-21 NOTE — Plan of Care (Signed)
°  Problem: Skin Integrity: Goal: Risk for impaired skin integrity will decrease Outcome: Progressing   Problem: Tissue Perfusion: Goal: Adequacy of tissue perfusion will improve Outcome: Progressing   Problem: Education: Goal: Knowledge of General Education information will improve Description: Including pain rating scale, medication(s)/side effects and non-pharmacologic comfort measures Outcome: Progressing   Problem: Clinical Measurements: Goal: Will remain free from infection Outcome: Progressing Goal: Diagnostic test results will improve Outcome: Progressing Goal: Respiratory complications will improve Outcome: Progressing Goal: Cardiovascular complication will be avoided Outcome: Progressing   Problem: Activity: Goal: Risk for activity intolerance will decrease Outcome: Progressing   Problem: Nutrition: Goal: Adequate nutrition will be maintained Outcome: Progressing   Problem: Coping: Goal: Level of anxiety will decrease Outcome: Progressing   Problem: Elimination: Goal: Will not experience complications related to bowel motility Outcome: Progressing Goal: Will not experience complications related to urinary retention Outcome: Progressing

## 2024-07-21 NOTE — Progress Notes (Signed)
 Patient is unable to perform essential mobility-related activities of daily living  in the home, specifically transferring to the toilet, ambulating to the kitchen to prepare meals without a Bariatric walker due to body habitus

## 2024-07-21 NOTE — Plan of Care (Signed)
 IV removed, discharge instructions reviewed, walker delivered to room and patient discharged to home

## 2024-07-21 NOTE — Inpatient Diabetes Management (Addendum)
 Inpatient Diabetes Program Recommendations  AACE/ADA: New Consensus Statement on Inpatient Glycemic Control (2015)  Target Ranges:  Prepandial:   less than 140 mg/dL      Peak postprandial:   less than 180 mg/dL (1-2 hours)      Critically ill patients:  140 - 180 mg/dL   Lab Results  Component Value Date   GLUCAP 161 (H) 07/21/2024   HGBA1C 7.8 (H) 07/18/2024    Review of Glycemic Control  Latest Reference Range & Units 07/19/24 16:22 07/19/24 21:36 07/20/24 07:46 07/20/24 11:42 07/20/24 17:02 07/20/24 21:40 07/21/24 07:56  Glucose-Capillary 70 - 99 mg/dL 834 (H) 825 (H) 850 (H) 231 (H) 223 (H) 218 (H) 161 (H)   Diabetes history: DM 2 Outpatient Diabetes medications:  Ozempic  0.5 mg weekly Current orders for Inpatient glycemic control:  Novolog  0-15 units tid with meals  Inpatient Diabetes Program Recommendations:    Note elevated blood sugars while in the hospital.  A1C indicates average blood sugars of 178 mg/dL.  Will need f/u with PCP.   Thanks,  Randall Bullocks, RN, BC-ADM Inpatient Diabetes Coordinator Pager [phone]  (8a-5p)

## 2024-07-21 NOTE — TOC Progression Note (Signed)
 Transition of Care [facility]) - Progression Note    Patient Details  Name: Bianca Anthony MRN: [id number] Date of Birth: [birth year]  Transition of Care [facility] CM/SW Contact  Corean ONEIDA Haddock, RN Phone Number: 07/21/2024, 11:39 AM  Clinical Narrative:     Per MD anticipated dc today, and need for Bariatric RW.  Referral made to Mitch with [employer]                     Expected Discharge Plan and Services                                               Social Drivers of Health (SDOH) Interventions SDOH Screenings   Food Insecurity: No Food Insecurity (07/19/2024)  Housing: Low Risk (07/19/2024)  Transportation Needs: No Transportation Needs (07/19/2024)  Utilities: Not At Risk (07/19/2024)  Alcohol Screen: Low Risk (03/11/2024)  Depression (PHQ2-9): Low Risk (03/11/2024)  Tobacco Use: High Risk (07/18/2024)    Readmission Risk Interventions     No data to display

## 2024-07-25 ENCOUNTER — Ambulatory Visit: Admitting: Podiatry

## 2024-07-25 DIAGNOSIS — M79674 Pain in right toe(s): Secondary | ICD-10-CM

## 2024-07-25 DIAGNOSIS — B351 Tinea unguium: Secondary | ICD-10-CM

## 2024-07-25 DIAGNOSIS — M79675 Pain in left toe(s): Secondary | ICD-10-CM

## 2024-07-25 DIAGNOSIS — E119 Type 2 diabetes mellitus without complications: Secondary | ICD-10-CM

## 2024-07-25 DIAGNOSIS — Z794 Long term (current) use of insulin: Secondary | ICD-10-CM

## 2024-07-26 ENCOUNTER — Encounter: Payer: Self-pay | Admitting: Podiatry

## 2024-07-26 NOTE — Progress Notes (Signed)
"  °  Subjective:  Patient ID: Bianca Anthony, female    DOB: 08-21-1970,  MRN: [id number]  Bianca Anthony presents to clinic today for preventative diabetic foot care for painful thick toenails that are difficult to trim. Pain interferes with ambulation. Aggravating factors include wearing enclosed shoe gear. Pain is relieved with periodic professional debridement.  She has chronic swelling of her feet and states she doesn't wear compression hose. Chief Complaint  Patient presents with   Diabetes    A1c 7.1 she saw Dr. Bernardo in Nov.   New problem(s): None.   PCP is Bianca Fend, DO.  Allergies[1]  Review of Systems: Negative except as noted in the HPI.  Objective: No changes noted in today's physical examination. There were no vitals filed for this visit. Bianca Anthony is a pleasant 53 y.o. female morbidly obese in NAD. AAO x 3.  Vascular Examination: CFT <3 seconds b/l. DP/PT pulses faintly palpable b/l. Pedal edema present b/l Skin temperature gradient warm to warm b/l. Digital hair sparse. No pain with calf compression. No ischemia or gangrene. No cyanosis or clubbing noted b/l.    Neurological Examination: Protective sensation diminished with 10g monofilament b/l.  Dermatological Examination: Pedal skin warm and supple b/l.   No open wounds. No interdigital macerations.  Toenails 1-5 b/l thick, discolored, elongated with subungual debris and pain on dorsal palpation.    No hyperkeratotic nor porokeratotic lesions  Musculoskeletal Examination: Muscle strength 5/5 to all lower extremity muscle groups bilaterally. No pain, crepitus or joint limitation noted with ROM bilateral LE.  Radiographs: None  Last A1c:      Latest Ref Rng & Units 07/18/2024   12:30 PM 06/13/2024   10:25 AM 03/11/2024    2:34 PM  Hemoglobin A1C  Hemoglobin-A1c 4.8 - 5.6 % 7.8  7.2  7.2     Assessment/Plan: 1. Pain due to onychomycosis of toenails of both feet   2. Type 2 diabetes  mellitus without complication, with long-term current use of insulin  [facility])   Patient was evaluated and treated. All patient's and/or POA's questions/concerns addressed on today's visit. Toenails 1-5 b/l debrided in length and girth without incident. Continue foot and shoe inspections daily. Monitor blood glucose per PCP/Endocrinologist's recommendations. Continue soft, supportive shoe gear daily. Report any pedal injuries to medical professional. Call office if there are any questions/concerns. -Patient/POA to call should there be question/concern in the interim.   Return in about 3 months (around 10/23/2024).  Bianca Anthony, DPM       LOCATION: [address], [place] [address]                   Office [phone]   [place] LOCATION: [address] [place], [place] [address] Office [phone]     [1]  Allergies Allergen Reactions   Acetazolamide Hives and Dermatitis   "

## 2024-08-30 ENCOUNTER — Telehealth: Payer: Self-pay

## 2024-08-30 NOTE — Telephone Encounter (Signed)
 Due to Dr. Felicitas scheduling already being full into late April.  I rescheduled procedure to his next available which is 11/24/24.  Instructions updated in [place] and mailed.  Message routed to [place] to make her aware.  Thanks,  [place], CMA

## 2024-08-30 NOTE — Telephone Encounter (Signed)
 Message received from [place], [place] in Endo stating patient has requested to reschedule her today's colonoscopy due to weather.  Message was received on 1/26.    LVM for her to call office back to reschedule.  Dr. Felicitas next availability will be 04/23 Thur.   Thanks,  [place], CMA

## 2024-09-13 ENCOUNTER — Ambulatory Visit: Admitting: Internal Medicine

## 2024-10-24 ENCOUNTER — Ambulatory Visit: Admitting: Podiatry

## 2024-11-24 ENCOUNTER — Encounter: Admission: RE | Payer: Self-pay | Source: Home / Self Care

## 2024-11-24 ENCOUNTER — Ambulatory Visit: Admission: RE | Admit: 2024-11-24 | Source: Home / Self Care | Admitting: Gastroenterology
